# Patient Record
Sex: Male | Born: 1960
Health system: Southern US, Community
[De-identification: ages and names within clinical notes are randomized; demographics above are authoritative.]

## PROBLEM LIST (undated history)

## (undated) DIAGNOSIS — K4091 Unilateral inguinal hernia, without obstruction or gangrene, recurrent: Secondary | ICD-10-CM

## (undated) DIAGNOSIS — F419 Anxiety disorder, unspecified: Secondary | ICD-10-CM

## (undated) DIAGNOSIS — I10 Essential (primary) hypertension: Secondary | ICD-10-CM

## (undated) HISTORY — DX: Anxiety disorder, unspecified: F41.9

## (undated) HISTORY — DX: Essential (primary) hypertension: I10

## (undated) HISTORY — PX: FINGER SURGERY: SHX640

## (undated) HISTORY — PX: OTHER SURGICAL HISTORY: SHX169

## (undated) HISTORY — DX: Unilateral inguinal hernia, without obstruction or gangrene, recurrent: K40.91

---
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
## 1996-01-18 HISTORY — PX: VASECTOMY: SHX75

## 2003-08-04 ENCOUNTER — Encounter: Admission: RE | Admit: 2003-08-04 | Discharge: 2003-08-04 | Payer: Self-pay | Admitting: Family Medicine

## 2003-12-04 ENCOUNTER — Ambulatory Visit: Payer: Self-pay | Admitting: Family Medicine

## 2003-12-08 ENCOUNTER — Ambulatory Visit: Payer: Self-pay | Admitting: *Deleted

## 2003-12-10 ENCOUNTER — Ambulatory Visit: Payer: Self-pay | Admitting: Family Medicine

## 2003-12-19 ENCOUNTER — Ambulatory Visit: Payer: Self-pay | Admitting: *Deleted

## 2003-12-26 ENCOUNTER — Ambulatory Visit: Payer: Self-pay | Admitting: *Deleted

## 2003-12-31 ENCOUNTER — Ambulatory Visit: Payer: Self-pay | Admitting: Family Medicine

## 2004-01-02 ENCOUNTER — Ambulatory Visit: Payer: Self-pay | Admitting: *Deleted

## 2004-01-23 ENCOUNTER — Ambulatory Visit: Payer: Self-pay | Admitting: *Deleted

## 2004-01-26 ENCOUNTER — Ambulatory Visit: Payer: Self-pay | Admitting: *Deleted

## 2004-01-30 ENCOUNTER — Ambulatory Visit: Payer: Self-pay | Admitting: Family Medicine

## 2004-02-02 ENCOUNTER — Ambulatory Visit: Payer: Self-pay | Admitting: *Deleted

## 2004-02-09 ENCOUNTER — Ambulatory Visit: Payer: Self-pay | Admitting: *Deleted

## 2004-02-16 ENCOUNTER — Ambulatory Visit: Payer: Self-pay | Admitting: *Deleted

## 2004-02-20 ENCOUNTER — Ambulatory Visit: Payer: Self-pay | Admitting: *Deleted

## 2004-02-27 ENCOUNTER — Ambulatory Visit: Payer: Self-pay | Admitting: Family Medicine

## 2004-05-11 ENCOUNTER — Ambulatory Visit: Payer: Self-pay | Admitting: Family Medicine

## 2004-05-31 ENCOUNTER — Ambulatory Visit: Payer: Self-pay | Admitting: Family Medicine

## 2004-07-05 ENCOUNTER — Ambulatory Visit: Payer: Self-pay | Admitting: *Deleted

## 2004-07-08 ENCOUNTER — Ambulatory Visit: Payer: Self-pay | Admitting: Family Medicine

## 2004-07-12 ENCOUNTER — Ambulatory Visit: Payer: Self-pay | Admitting: *Deleted

## 2004-07-26 ENCOUNTER — Ambulatory Visit: Payer: Self-pay | Admitting: *Deleted

## 2004-08-10 ENCOUNTER — Ambulatory Visit: Payer: Self-pay | Admitting: *Deleted

## 2004-08-13 ENCOUNTER — Ambulatory Visit: Payer: Self-pay | Admitting: *Deleted

## 2004-08-23 ENCOUNTER — Ambulatory Visit: Payer: Self-pay | Admitting: *Deleted

## 2004-09-17 ENCOUNTER — Ambulatory Visit: Payer: Self-pay | Admitting: Family Medicine

## 2005-01-14 ENCOUNTER — Ambulatory Visit: Payer: Self-pay | Admitting: Family Medicine

## 2006-01-30 ENCOUNTER — Ambulatory Visit: Payer: Self-pay | Admitting: Family Medicine

## 2006-01-30 LAB — CONVERTED CEMR LAB
ALT: 35 units/L (ref 0–40)
CO2: 31 meq/L (ref 19–32)
Chloride: 106 meq/L (ref 96–112)
Creatinine, Ser: 1.2 mg/dL (ref 0.4–1.5)
GFR calc Af Amer: 84 mL/min
GFR calc non Af Amer: 69 mL/min
Glucose, Bld: 102 mg/dL — ABNORMAL HIGH (ref 70–99)
Glucose, Bld: 102 mg/dL — ABNORMAL HIGH (ref 70–99)
HDL: 37.7 mg/dL — ABNORMAL LOW (ref >39.0)
LDL Cholesterol: 132 mg/dL — ABNORMAL HIGH (ref 0–99)
Lymphocytes Relative: 38 % (ref 12.0–46.0)
MCHC: 34.1 g/dL (ref 30.0–36.0)
MCV: 95.1 fL (ref 78.0–100.0)
Monocytes Absolute: 0.5 10*3/uL (ref 0.2–0.7)
Monocytes Relative: 11.4 % — ABNORMAL HIGH (ref 3.0–11.0)
Neutro Abs: 2.1 10*3/uL (ref 1.4–7.7)
Platelets: 163 10*3/uL (ref 150–400)
RBC: 4.64 M/uL (ref 4.22–5.81)
Sodium: 141 meq/L (ref 135–145)
TSH: 1.26 microintl units/mL (ref 0.35–5.50)
Total CHOL/HDL Ratio: 5.1
Triglycerides: 117 mg/dL (ref 0–149)
VLDL: 23 mg/dL (ref 0–40)

## 2006-02-06 ENCOUNTER — Ambulatory Visit: Payer: Self-pay | Admitting: Family Medicine

## 2006-04-28 ENCOUNTER — Ambulatory Visit: Payer: Self-pay | Admitting: Family Medicine

## 2006-05-12 ENCOUNTER — Ambulatory Visit: Payer: Self-pay | Admitting: *Deleted

## 2006-05-19 ENCOUNTER — Ambulatory Visit: Payer: Self-pay | Admitting: *Deleted

## 2006-05-21 ENCOUNTER — Emergency Department (HOSPITAL_COMMUNITY): Admission: EM | Admit: 2006-05-21 | Discharge: 2006-05-21 | Payer: Self-pay | Admitting: Emergency Medicine

## 2006-05-26 ENCOUNTER — Ambulatory Visit: Payer: Self-pay | Admitting: Internal Medicine

## 2006-09-28 DIAGNOSIS — K4090 Unilateral inguinal hernia, without obstruction or gangrene, not specified as recurrent: Secondary | ICD-10-CM | POA: Insufficient documentation

## 2006-09-28 DIAGNOSIS — I10 Essential (primary) hypertension: Secondary | ICD-10-CM

## 2006-09-28 DIAGNOSIS — J309 Allergic rhinitis, unspecified: Secondary | ICD-10-CM | POA: Insufficient documentation

## 2006-10-05 ENCOUNTER — Telehealth: Payer: Self-pay | Admitting: Family Medicine

## 2006-10-20 ENCOUNTER — Ambulatory Visit: Payer: Self-pay | Admitting: *Deleted

## 2006-10-27 ENCOUNTER — Ambulatory Visit: Payer: Self-pay | Admitting: *Deleted

## 2006-11-03 ENCOUNTER — Ambulatory Visit: Payer: Self-pay | Admitting: *Deleted

## 2007-01-01 ENCOUNTER — Telehealth: Payer: Self-pay | Admitting: Family Medicine

## 2007-01-03 ENCOUNTER — Encounter: Admission: RE | Admit: 2007-01-03 | Discharge: 2007-01-10 | Payer: Self-pay | Admitting: Family Medicine

## 2007-01-11 ENCOUNTER — Encounter: Admission: RE | Admit: 2007-01-11 | Discharge: 2007-04-11 | Payer: Self-pay | Admitting: Family Medicine

## 2007-03-02 ENCOUNTER — Ambulatory Visit: Payer: Self-pay | Admitting: Family Medicine

## 2007-03-02 LAB — CONVERTED CEMR LAB
ALT: 25 units/L (ref 0–53)
AST: 29 units/L (ref 0–37)
Albumin: 4.2 g/dL (ref 3.5–5.2)
Alkaline Phosphatase: 64 units/L (ref 39–117)
BUN: 20 mg/dL (ref 6–23)
Basophils Absolute: 0 10*3/uL (ref 0.0–0.1)
Basophils Relative: 0 % (ref 0.0–1.0)
Bilirubin Urine: NEGATIVE
CO2: 30 meq/L (ref 19–32)
Calcium: 9.4 mg/dL (ref 8.4–10.5)
Chloride: 103 meq/L (ref 96–112)
Cholesterol: 197 mg/dL (ref 0–200)
Creatinine, Ser: 1.2 mg/dL (ref 0.4–1.5)
HDL: 35.2 mg/dL — ABNORMAL LOW (ref >39.0)
Hemoglobin: 15.4 g/dL (ref 13.0–17.0)
LDL Cholesterol: 143 mg/dL — ABNORMAL HIGH (ref 0–99)
MCHC: 33.9 g/dL (ref 30.0–36.0)
Monocytes Absolute: 0.6 10*3/uL (ref 0.2–0.7)
Monocytes Relative: 14.1 % — ABNORMAL HIGH (ref 3.0–11.0)
Nitrite: NEGATIVE
Protein, U semiquant: NEGATIVE
RBC: 4.77 M/uL (ref 4.22–5.81)
RDW: 11.9 % (ref 11.5–14.6)
Specific Gravity, Urine: 1.02
Total Bilirubin: 1.4 mg/dL — ABNORMAL HIGH (ref 0.3–1.2)
Total CHOL/HDL Ratio: 5.6
Total Protein: 7 g/dL (ref 6.0–8.3)
Urobilinogen, UA: 0.2
VLDL: 19 mg/dL (ref 0–40)
pH: 6

## 2007-03-08 ENCOUNTER — Ambulatory Visit: Payer: Self-pay | Admitting: Family Medicine

## 2007-03-08 DIAGNOSIS — F4322 Adjustment disorder with anxiety: Secondary | ICD-10-CM

## 2007-06-14 ENCOUNTER — Telehealth: Payer: Self-pay | Admitting: Internal Medicine

## 2007-10-31 ENCOUNTER — Telehealth: Payer: Self-pay | Admitting: Family Medicine

## 2007-12-18 ENCOUNTER — Ambulatory Visit: Payer: Self-pay | Admitting: Family Medicine

## 2007-12-18 DIAGNOSIS — J029 Acute pharyngitis, unspecified: Secondary | ICD-10-CM | POA: Insufficient documentation

## 2007-12-18 DIAGNOSIS — F528 Other sexual dysfunction not due to a substance or known physiological condition: Secondary | ICD-10-CM | POA: Insufficient documentation

## 2007-12-25 ENCOUNTER — Ambulatory Visit: Payer: Self-pay | Admitting: Family Medicine

## 2007-12-25 LAB — CONVERTED CEMR LAB: Rapid Strep: NEGATIVE

## 2007-12-28 ENCOUNTER — Telehealth (INDEPENDENT_AMBULATORY_CARE_PROVIDER_SITE_OTHER): Payer: Self-pay | Admitting: *Deleted

## 2008-12-05 ENCOUNTER — Ambulatory Visit: Payer: Self-pay | Admitting: Family Medicine

## 2008-12-05 DIAGNOSIS — M25519 Pain in unspecified shoulder: Secondary | ICD-10-CM | POA: Insufficient documentation

## 2009-01-06 ENCOUNTER — Telehealth: Payer: Self-pay | Admitting: Family Medicine

## 2009-03-10 ENCOUNTER — Telehealth: Payer: Self-pay | Admitting: Family Medicine

## 2009-03-20 ENCOUNTER — Ambulatory Visit: Payer: Self-pay | Admitting: Family Medicine

## 2009-03-20 DIAGNOSIS — M25559 Pain in unspecified hip: Secondary | ICD-10-CM

## 2009-04-13 ENCOUNTER — Telehealth: Payer: Self-pay | Admitting: Family Medicine

## 2009-04-22 ENCOUNTER — Ambulatory Visit: Payer: Self-pay | Admitting: Family Medicine

## 2009-05-14 ENCOUNTER — Telehealth: Payer: Self-pay | Admitting: Family Medicine

## 2009-06-09 ENCOUNTER — Telehealth: Payer: Self-pay | Admitting: Family Medicine

## 2009-09-28 ENCOUNTER — Telehealth: Payer: Self-pay | Admitting: Family Medicine

## 2009-09-28 ENCOUNTER — Ambulatory Visit: Payer: Self-pay | Admitting: Family Medicine

## 2009-09-28 LAB — CONVERTED CEMR LAB
AST: 25 units/L (ref 0–37)
Alkaline Phosphatase: 67 units/L (ref 39–117)
Basophils Absolute: 0 10*3/uL (ref 0.0–0.1)
Bilirubin, Direct: 0.1 mg/dL (ref 0.0–0.3)
CO2: 29 meq/L (ref 19–32)
Calcium: 9.2 mg/dL (ref 8.4–10.5)
Creatinine, Ser: 1.2 mg/dL (ref 0.4–1.5)
Eosinophils Absolute: 0.1 10*3/uL (ref 0.0–0.7)
GFR calc non Af Amer: 67.56 mL/min (ref >60)
Glucose, Bld: 89 mg/dL (ref 70–99)
HDL: 34.7 mg/dL — ABNORMAL LOW (ref >39.00)
Ketones, urine, test strip: NEGATIVE
Lymphocytes Relative: 31.7 % (ref 12.0–46.0)
MCHC: 34.7 g/dL (ref 30.0–36.0)
Monocytes Relative: 11.3 % (ref 3.0–12.0)
Neutrophils Relative %: 54.9 % (ref 43.0–77.0)
Nitrite: NEGATIVE
RBC: 4.48 M/uL (ref 4.22–5.81)
RDW: 13.2 % (ref 11.5–14.6)
Specific Gravity, Urine: 1.025
Total CHOL/HDL Ratio: 5
Triglycerides: 153 mg/dL — ABNORMAL HIGH (ref 0.0–149.0)
Urobilinogen, UA: 0.2
VLDL: 30.6 mg/dL (ref 0.0–40.0)

## 2009-09-29 ENCOUNTER — Telehealth: Payer: Self-pay | Admitting: Family Medicine

## 2009-10-02 ENCOUNTER — Ambulatory Visit: Payer: Self-pay | Admitting: Family Medicine

## 2010-02-07 ENCOUNTER — Encounter: Payer: Self-pay | Admitting: Family Medicine

## 2010-02-09 ENCOUNTER — Telehealth: Payer: Self-pay | Admitting: Family Medicine

## 2010-02-11 ENCOUNTER — Telehealth: Payer: Self-pay | Admitting: Family Medicine

## 2010-02-12 ENCOUNTER — Ambulatory Visit
Admission: RE | Admit: 2010-02-12 | Discharge: 2010-02-12 | Payer: Self-pay | Source: Home / Self Care | Attending: Family Medicine | Admitting: Family Medicine

## 2010-02-12 ENCOUNTER — Telehealth: Payer: Self-pay | Admitting: Family Medicine

## 2010-02-12 ENCOUNTER — Ambulatory Visit: Admit: 2010-02-12 | Payer: Self-pay | Admitting: Family Medicine

## 2010-02-17 NOTE — Progress Notes (Signed)
Summary: Pt needs to get FMLA form done  Phone Note Call from Patient Call back at [phone] cell   Caller: Patient Summary of Call: Pt called and wants to know what step he needs to take to get a medical leave of absense from work and to get FML form done by Dr. Tawanna Cooler.  Initial call taken by: Lucy Antigua,  April 13, 2009 1:55 PM  Follow-up for Phone Call        for what reason???????????? Follow-up by: Roderick Pee MD,  April 13, 2009 2:45 PM  Additional Follow-up for Phone Call Additional follow up Details #1::        patient states that he is going through a nasty divorce with his court date on Monday.  Also work is going through [employer] safety [employer] and "coming down one him hard".  He feels his stress level is becoming too difficult and may need some time off from work for anxiety.  He can not leave work because he will loose his commition and possible job.  he does plan on looking for a new job soon. Additional Follow-up by: Kern Reap CMA Duncan Dull),  April 14, 2009 9:52 AM    Additional Follow-up for Phone Call Additional follow up Details #2::    t FMLA is not appropriate for issues related to job stress divorce proceedings etc. Follow-up by: Roderick Pee MD,  April 14, 2009 11:35 AM  Additional Follow-up for Phone Call Additional follow up Details #3:: Details for Additional Follow-up Action Taken: left message on machine for patient  Additional Follow-up by: Kern Reap CMA Duncan Dull),  April 14, 2009 5:20 PM

## 2010-02-17 NOTE — Progress Notes (Signed)
  Phone Note Call from Patient   Caller: Patient Call For: Roderick Pee MD Summary of Call: Pt is concerned about his STD symptoms, and does not want to wait until he can get here from [place].  He will go to URGENT care there. Initial call taken by: Lynann Beaver CMA,  September 29, 2009 10:16 AM

## 2010-02-17 NOTE — Assessment & Plan Note (Signed)
Summary: CPX // RS   Vital Signs:  Patient profile:   50 year old male Height:      72 inches Weight:      215 pounds BMI:     29.26 Temp:     97.3 degrees F oral BP sitting:   130 / 84  (left arm) Cuff size:   regular  Vitals Entered By: Kern Reap CMA Duncan Dull) (October 02, 2009 4:47 PM) CC: cpx Is Patient Diabetic? No Pain Assessment Patient in pain? no        CC:  cpx.  History of Present Illness: Michael Bradford is a 50 year old divorced male, nonsmoker, who comes in today for physical examination  He has a history of underlying hypertension, for which he takes losartan 50 mg daily.  BP 130/84.  This is consistent with what his blood pressure is at home.  He takes Klonopin 1 mg a half a tablet at bedtime to 3 times a week for sleep.  He states that he exercises that day.  He sleeps well, if he doesn't exercise and has sleep dysfunction.  He also takes a p.r.n. Ativan .5.  Prior to speaking.  He also takes p.r.n. Zovirax.  He gets routine eye care.  Dental care.  Tetanus 2007 seasonal flu shot today.  He also has some mild allergic rhinitis.  Last year.  We sent him to orthopedics for evaluation of hip pain.  The workup shows some evidence of old trauma.  Indeed, when he was 15.  He fell 20 feet.  He states they gave him a prednisone Dosepak.  The pain went away.  If not recurred.  His neck process of moving back to [place].  Preventive Screening-Counseling & Management  Alcohol-Tobacco     Smoking Cessation Counseling: yes  Allergies: No Known Drug Allergies  Past History:  Past medical, surgical, family and social histories (including risk factors) reviewed, and no changes noted (except as noted below).  Past Medical History: Reviewed history from 03/08/2007 and no changes required. Allergic rhinitis Hypertension R ing hernia--aymptomatic herpes simplex anxiety, situational  Past Surgical History: Reviewed history from 09/28/2006 and no changes  required. wisdom teeth R eye trauma HTN eval age 50 finger surgery X3 vocal polyps X2  Family History: Reviewed history from 03/08/2007 and no changes required. father died last year at 50, smoker, obese, hypertension, chronic renal failure, and an MRI mother 27, room arthritis, smoker, I be her blood pressure, diabetes 3 sisters in good health.  One has thyroid trouble two brothers one has hypertension and hyperlipidemia is in a coma from a motor vehicle accident other brother in good health  Social History: Reviewed history from 03/08/2007 and no changes required. 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 no    Any prior history of allergic reactions to egg and/or gelatin? no    Do you have a sensitivity to the preservative Thimersol? no    Do you have a past history of Guillan-Barre Syndrome? no    Do you currently have an acute febrile illness? no    Have you ever had a severe reaction to latex? no    Vaccine information given and explained to patient? yes    Are you currently pregnant? no  Lot Number:[id number]   Exp Date:6/50/2012   Site Given  Left Deltoid IM   Physical Exam  General:  Well-developed,well-nourished,in no acute distress; alert,appropriate and cooperative throughout examination Head:  Normocephalic and atraumatic without obvious abnormalities. No apparent alopecia or balding. Eyes:  No corneal or conjunctival inflammation noted. EOMI. Perrla. Funduscopic exam benign, without hemorrhages, exudates or papilledema. Vision grossly normal. Ears:  External ear exam shows no significant lesions or deformities.  Otoscopic examination reveals clear canals, tympanic membranes are intact bilaterally without bulging, retraction, inflammation or discharge. Hearing  is grossly normal bilaterally. Nose:  External nasal examination shows no deformity or inflammation. Nasal mucosa are pink and moist without lesions or exudates. Mouth:  Oral mucosa and oropharynx without lesions or exudates.  Teeth in good repair. Neck:  No deformities, masses, or tenderness noted. Chest Wall:  No deformities, masses, tenderness or gynecomastia noted. Breasts:  No masses or gynecomastia noted Lungs:  Normal respiratory effort, chest expands symmetrically. Lungs are clear to auscultation, no crackles or wheezes. Heart:  Normal rate and regular rhythm. S1 and S2 normal without gallop, murmur, click, rub or other extra sounds. Abdomen:  Bowel sounds positive,abdomen soft and non-tender without masses, organomegaly or hernias noted. Rectal:  No external abnormalities noted. Normal sphincter tone. No rectal masses or tenderness. Genitalia:  Testes bilaterally descended without nodularity, tenderness or masses. No scrotal masses or lesions. No penis lesions or urethral discharge. Prostate:  Prostate gland firm and smooth, no enlargement, nodularity, tenderness, mass, asymmetry or induration. Msk:  No deformity or scoliosis noted of thoracic or lumbar spine.   Pulses:  R and L carotid,radial,femoral,dorsalis pedis and posterior tibial pulses are full and equal bilaterally Extremities:  No clubbing, cyanosis, edema, or deformity noted with normal full range of motion of all joints.   Neurologic:  No cranial nerve deficits noted. Station and gait are normal. Plantar reflexes are down-going bilaterally. DTRs are symmetrical throughout. Sensory, motor and coordinative functions appear intact. Skin:  Intact without suspicious lesions or rashes Cervical Nodes:  No lymphadenopathy noted Axillary Nodes:  No palpable lymphadenopathy Inguinal Nodes:  No significant adenopathy Psych:  Cognition and judgment appear intact. Alert and cooperative with normal attention span and concentration. No  apparent delusions, illusions, hallucinations   Impression & Recommendations:  Problem # 1:  PHYSICAL EXAMINATION (ICD-V70.0) Assessment Unchanged  Problem # 2:  HYPERTENSION (ICD-401.9) Assessment: Improved  His updated medication list for this problem includes:    Cozaar 50 Mg Tabs (Losartan potassium) .Marland Kitchen... Take one tab once daily  Orders: EKG w/ Interpretation (93000)  Problem # 3:  ALLERGIC RHINITIS (ICD-477.9) Assessment: Improved  Problem # 4:  ADJUSTMENT DISORDER WITH ANXIETY (ICD-309.24) Assessment: Improved  Complete Medication List: 1)  Advil 200 Mg Tabs (Ibuprofen) .... As needed 2)  Zovirax 200 Mg Caps (Acyclovir) .... 3 in am, 2 in pm as needed 3)  Daily Multiple Vitamins Tabs (Multiple vitamin) .... Once daily 4)  Klonopin 1 Mg Tabs (Clonazepam) .... Take one tab at bedtime as needed for sleep 5)  Cozaar 50 Mg Tabs (Losartan potassium) .... Take one tab once daily 6)  Ativan 0.5 Mg Tabs (Lorazepam) .Marland Kitchen.. 1 by mouth 1 hour < speaking  Other Orders: Admin 1st Vaccine (16109) Flu Vaccine 83yrs + (60454)  Patient Instructions: 1)  Please schedule a follow-up appointment in 1 year. 2)  Tobacco is very bad for your health and your loved ones! You Should stop smoking!. 3)  Stop Smoking Tips: Choose a Quit date. Cut  down before the Quit date. decide what you will do as a substitute when you feel the urge to smoke(gum,toothpick,exercise). 4)  It is important that you exercise regularly at least 20 minutes 5 times a week. If you develop chest pain, have severe difficulty breathing, or feel very tired , stop exercising immediately and seek medical attention. 5)  Take an Aspirin every day. Prescriptions: COZAAR 50 MG TABS (LOSARTAN POTASSIUM) take one tab once daily  #100 x 3   Entered and Authorized by:   Roderick Pee MD   Signed by:   Roderick Pee MD on 10/02/2009   Method used:   Print then Give to Patient   RxID:   [id number] ZOVIRAX 200 MG  CAPS  (ACYCLOVIR) 3 in am, 2 in pm as needed  #100 Capsule x 2   Entered and Authorized by:   Roderick Pee MD   Signed by:   Roderick Pee MD on 10/02/2009   Method used:   Print then Give to Patient   RxID:   [id number] KLONOPIN 1 MG TABS (CLONAZEPAM) take one tab at bedtime as needed for sleep  #50 x 3   Entered and Authorized by:   Roderick Pee MD   Signed by:   Roderick Pee MD on 10/02/2009   Method used:   Print then Give to Patient   RxID:   [id number] ATIVAN 0.5 MG TABS (LORAZEPAM) 1 by mouth 1 hour < speaking  #20 x 1   Entered and Authorized by:   Roderick Pee MD   Signed by:   Roderick Pee MD on 10/02/2009   Method used:   Print then Give to Patient   RxID:   [id number]

## 2010-02-17 NOTE — Assessment & Plan Note (Signed)
Summary: MED CHECK//CCM   Vital Signs:  Patient profile:   50 year old male Weight:      210 pounds Temp:     98.9 degrees F oral BP sitting:   150 / 88  (left arm) Cuff size:   regular  Vitals Entered By: Kern Reap CMA Duncan Dull) (March 20, 2009 2:29 PM)  Reason for Visit follow up meds, hip pain, tick bite  History of Present Illness: Michael Bradford is a 50 year old, divorced male, nonsmoker, who comes in today for evaluation of 3 problems.  He has underlying hypertension, which he takes Cozaar, 50 mg daily BP at home 120/76.  BP here 150/80.  We will go by his home blood pressure readings, because we've checked his device, and we, know it's accurate  Three days ago, he noticed a tick on his left upper arm.  He tried to pull it off, and it bit him.  He has a slight red mark.  No fever.  The issue of [facility] spotted fever was explained to Michael Bradford.  He noticed this within the next two weeks he starts running fever to call immediately.  He also takes V  50 mg p.r.n. for ED he finds it costs too high.  He also is pain in his right buttocks when he begins to play soccer.  He said his hips x-rayed before and they been normal.  He describes the discomfort as something that comes and goes on a scale of one to 10 it can be a 1.8.  He takes Motrin with good relief.  After the third or fourth day of soccer practice.  The discomfort goes away  Allergies: No Known Drug Allergies  Past History:  Past medical, surgical, family and social histories (including risk factors) reviewed for relevance to current acute and chronic problems.  Past Medical History: Reviewed history from 03/08/2007 and no changes required. Allergic rhinitis Hypertension R ing hernia--aymptomatic herpes simplex anxiety, situational  Past Surgical History: Reviewed history from 09/28/2006 and no changes required. wisdom teeth R eye trauma HTN eval age 71 finger surgery X3 vocal polyps X2  Family  History: Reviewed history from 03/08/2007 and no changes required. father died last year at 58, smoker, obese, hypertension, chronic renal failure, and an MRI mother 84, room arthritis, smoker, I be her blood pressure, diabetes 3 sisters in good health.  One has thyroid trouble two brothers one has hypertension and hyperlipidemia is in a coma from a motor vehicle accident other brother in good health  Social History: Reviewed history from 03/08/2007 and no changes required. 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 Uad    Viagra 50 Mg Tabs (Sildenafil citrate) .Marland Kitchen... Take one tab at bedtime as needed His updated medication list for this problem includes:    Viagra 100 Mg Tabs (Sildenafil citrate) ..... Uad  Orders: Prescription Created Electronically [id number])  Problem # 2:  HYPERTENSION (ICD-401.9) Assessment: Improved  The  following medications were removed from the medication list:    Hyzaar 50-12.5 Mg Tabs (Losartan potassium-hctz) .Marland Kitchen... Take 1 tablet by mouth every morning His updated medication list for this problem includes:    Cozaar 50 Mg Tabs (Losartan potassium) .Marland Kitchen... Take one tab once daily  Problem # 3:  HIP PAIN, RIGHT (ICD-719.45) Assessment: New  His updated medication list for this problem includes:    Advil 200 Mg Tabs (Ibuprofen) .Marland Kitchen... As needed  Orders: Prescription Created  Electronically [phone])  Problem # 4:  TICK BITE (ICD-E906.4) Assessment: New  Orders: Prescription Created Electronically [phone])  Complete Medication List: 1)  Advil 200 Mg Tabs (Ibuprofen) .... As needed 2)  Zovirax 200 Mg Caps (Acyclovir) .... 3 in am, 2 in pm as needed 3)  Daily Multiple Vitamins Tabs (Multiple vitamin) .... Once daily 4)  Klonopin 1 Mg Tabs (Clonazepam) .... Take one tab at bedtime as needed for sleep 5)  Cozaar 50 Mg Tabs (Losartan potassium) .... Take one tab once daily 6)  Viagra 100 Mg Tabs (Sildenafil citrate) .... Uad  Patient Instructions: 1)  if in the next two weeks u  run fever,  ache all over.  Call immediately 2)  you can make the Viagra, more cost effective by taking the hundred milligram pills, and cutting them in half as or 1/4  3)  Take 600 mg of Motrin one hour prior to exercise after exercise.  Ice. 4)  Continue the Cozaar, 50 mg daily.  Since y  blood pressure is normal........  Just checking weekly Prescriptions: COZAAR 50 MG TABS (LOSARTAN POTASSIUM) take one tab once daily  #100 x 3   Entered and Authorized by:   Roderick Pee MD   Signed by:   Roderick Pee MD on 03/20/2009   Method used:   Electronically to        [facility] [phone]* (retail)       [address]       [place], [place]  [address]       Ph: [phone] or [phone]       Fax: [phone]   RxID:   [id number] COZAAR 50 MG TABS (LOSARTAN POTASSIUM) take one tab once daily  #100 x 3   Entered and Authorized by:   Roderick Pee MD   Signed by:   Roderick Pee MD on 03/20/2009   Method used:   Print then Give to Patient   RxID:   [id number] VIAGRA 100 MG TABS (SILDENAFIL CITRATE) UAD  #6 x 11   Entered and Authorized by:   Roderick Pee MD   Signed by:   Roderick Pee MD on 03/20/2009   Method used:   Print then Give to Patient   RxID:   [id number]

## 2010-02-17 NOTE — Progress Notes (Signed)
Summary: seroquel rx  Phone Note From Pharmacy   Summary of Call: patient is requesting a rx for seroquel is this okay to fill? Initial call taken by: Kern Reap CMA Duncan Dull),  May 14, 2009 8:49 AM  Follow-up for Phone Call        I don't use this product.......... this is a product.that is typically prescribed by a psychiatrist.......Marland Kitchen recommend Dr. Rolly Pancake, [place] Follow-up by: Roderick Pee MD,  May 14, 2009 9:05 AM  Additional Follow-up for Phone Call Additional follow up Details #1::        Pt given Dr. Nelida Meuse recommendations. Additional Follow-up by: Lynann Beaver CMA,  May 14, 2009 9:54 AM

## 2010-02-17 NOTE — Assessment & Plan Note (Signed)
Summary: 1 month rov/njr   Vital Signs:  Patient profile:   50 year old male Weight:      212 pounds Temp:     97.9 degrees F oral BP sitting:   120 / 80  (left arm) Cuff size:   regular  Vitals Entered By: Kern Reap CMA Duncan Dull) (April 22, 2009 8:31 AM) CC: follow-up visit   CC:  follow-up visit.  History of Present Illness: Michael Bradford is a 50 year old male in the process of a divorce.  He was also moving to [place], [place] to begin a new job this coming May, who comes in today for evaluation of hypertension, and anxiety.  His hypertension is treated with Cozaar 50 mg daily.  BP 120/80.  No side effects from medication.  His anxiety is related to the fact that he is in the midst of a divorce and is moving to [place] to assume a new job.  He states his current company where he works now as aware of his new job and he works in a hostile environment.  Because of the job stress and the marital stress he would like to take a couple weeks off of work.  I explained this would be up to him.Marland Kitchen  He would like an FMLA form.  We gave him the criteria for a fmla and asked him to work with the HR person.  He says the HR person is in [place].  They have no person on site.  I told him I would be happy to sign the form of a fall and  that he could deal with the HR people to define if the fmla  process in light of this situation.  Allergies: No Known Drug Allergies  Past History:  Past medical, surgical, family and social histories (including risk factors) reviewed for relevance to current acute and chronic problems.  Past Medical History: Reviewed history from 03/08/2007 and no changes required. Allergic rhinitis Hypertension R ing hernia--aymptomatic herpes simplex anxiety, situational  Past Surgical History: Reviewed history from 09/28/2006 and no changes required. wisdom teeth R eye trauma HTN eval age 79 finger surgery X3 vocal polyps X2  Family History: Reviewed  history from 03/08/2007 and no changes required. father died last year at 75, smoker, obese, hypertension, chronic renal failure, and an MRI mother 46, room arthritis, smoker, I be her blood pressure, diabetes 3 sisters in good health.  One has thyroid trouble two brothers one has hypertension and hyperlipidemia is in a coma from a motor vehicle accident other brother in good health  Social History: Reviewed history from 03/08/2007 and no changes required. 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 Take one tab once daily  Complete Medication List: 1)  Advil 200 Mg Tabs (Ibuprofen) .... As needed 2)  Zovirax 200 Mg Caps (Acyclovir) .... 3 in am, 2 in pm as needed 3)  Daily Multiple Vitamins Tabs (Multiple vitamin) .... Once daily 4)  Klonopin 1 Mg Tabs (Clonazepam) .... Take one tab at bedtime as needed for sleep 5)  Cozaar 50 Mg Tabs (Losartan potassium) .... Take one tab once daily 6)  Viagra 100 Mg Tabs (Sildenafil citrate) .... Uad  Patient Instructions: 1)    Continue current medications.  Return for a physical examination ASAP

## 2010-02-17 NOTE — Progress Notes (Signed)
Summary: Pt has question re: cpx and personal matter.Req Dr. Tawanna Cooler to call  Phone Note Call from Patient Call back at Home Phone [phone]   Caller: Patient Summary of Call: Pt has questions re: recent cpx and a personal question. Pt is req that Dr. Tawanna Cooler return his call.  Initial call taken by: Lucy Antigua,  September 28, 2009 4:00 PM  Follow-up for Phone Call        Fleet Contras please call Follow-up by: Roderick Pee MD,  September 28, 2009 4:14 PM  Additional Follow-up for Phone Call Additional follow up Details #1::        patient is calling because he had unprotected sex 2 weeks ago and now he is having irritation and discharge.  patient's cpx is friday and he is out of town.  any suggestion? Additional Follow-up by: Kern Reap CMA Duncan Dull),  September 28, 2009 5:09 PM    Additional Follow-up for Phone Call Additional follow up Details #2::    come here or go to an urgent care in [place]. Follow-up by: Roderick Pee MD,  September 29, 2009 10:45 AM

## 2010-02-17 NOTE — Progress Notes (Signed)
Summary: refill  Phone Note Refill Request Message from:  Fax from Pharmacy on Jun 09, 2009 4:36 PM  Refills Requested: Medication #1:  KLONOPIN 1 MG TABS take one tab at bedtime as needed for sleep Initial call taken by: Kern Reap CMA Duncan Dull),  Jun 09, 2009 4:36 PM    Prescriptions: KLONOPIN 1 MG TABS (CLONAZEPAM) take one tab at bedtime as needed for sleep  #30 x 3   Entered by:   Kern Reap CMA (AAMA)   Authorized by:   Roderick Pee MD   Signed by:   Kern Reap CMA (AAMA) on 06/09/2009   Method used:   Handwritten   RxID:   [id number]

## 2010-02-17 NOTE — Progress Notes (Signed)
Summary: ADVICE REQUESTED  Phone Note Call from Patient   Caller: Patient [phone] Reason for Call: Talk to Doctor Summary of Call: Pt called to adv that in regards to  Generic: Hydrochlorothiazide/Olmesartan  /   Brand Name: Benicar HCT ... Pt adv that he is having severe side effects (decrease in sex drive mostly) that he said he didn't have taking Benicar.... Pt would like to have your advice as to what to do??  Is there any difference between Hydrochlorothiazide/Olmesartan / Benicar HCT .Marland KitchenMarland Kitchen??  Could you prescribe med to help with side effects?  Pt would also like to have a refill RX for seroquel .[facility]land Kitchen... [facility] [address]  Initial call taken by: Debbra Riding,  March 10, 2009 9:00 AM  Follow-up for Phone Call        he may switch from the Hyzaar to Cozaar 50 mg daily.  It may be the diuretic part of the medication.  This causing a negative effect on his sex life.  Also located at Viagra 50 mg p.r.n., dispense 6,,,,, refills x 11  I don't prescribe Seroquel that should be done by a psychiatrist Follow-up by: Roderick Pee MD,  March 10, 2009 9:57 AM  Additional Follow-up for Phone Call Additional follow up Details #1::        patient is aware.  patient would also like to try a stronger dose of klonopin if possible? Additional Follow-up by: Kern Reap CMA Duncan Dull),  March 10, 2009 2:33 PM    Additional Follow-up for Phone Call Additional follow up Details #2::    seraquil and klonopin he can have a significant medication interactions.  This should be dealt with by the psychiatrist Follow-up by: Roderick Pee MD,  March 10, 2009 2:38 PM  New/Updated Medications: COZAAR 50 MG TABS (LOSARTAN POTASSIUM) take one tab once daily VIAGRA 50 MG TABS (SILDENAFIL CITRATE) take one tab at bedtime as needed Prescriptions: VIAGRA 50 MG TABS (SILDENAFIL CITRATE) take one tab at bedtime as needed  #6 x 11   Entered by:   Kern Reap CMA (AAMA)   Authorized by:   Roderick Pee MD   Signed by:   Kern Reap CMA (AAMA) on 03/10/2009   Method used:   [facility]        [facility] [phone]* (retail)       [address]       [place], [place]  [address]       Ph: [phone] or [phone]       Fax: [phone]   RxID:   [id number] COZAAR 50 MG TABS (LOSARTAN POTASSIUM) take one tab once daily  #30 x 0   Entered by:   Kern Reap CMA (AAMA)   Authorized by:   Roderick Pee MD   Signed by:   Kern Reap CMA (AAMA) on 03/10/2009   Method used:   [facility]ically to        [facility] [phone]* (retail)       [address]       [place], [place]  [address]       Ph: [phone] or [phone]       Fax: [phone]   RxID:   [id number]

## 2010-02-18 NOTE — Progress Notes (Signed)
Summary: REQUEST FOR RX  OR  APPT WITH ANOTHER DR TODAY  Phone Note Call from Patient   Caller: Patient Summary of Call: Pt can't come in for appt this afternoon as requested  because he has to leave by 1pm to go out of town and would like to [facility]..... or he can come in to be seen by another physician if Rx can't be sent in.  Initial call taken by: Debbra Riding,  February 12, 2010 11:32 AM  Follow-up for Phone Call        spoke with patient and he will come in this afternoon Follow-up by: Kern Reap CMA Duncan Dull),  February 12, 2010 11:41 AM

## 2010-02-18 NOTE — Progress Notes (Signed)
Summary: referral to gi  Phone Note Call from Patient Call back at Home Phone [phone]   Caller: Patient Call For: Roderick Pee MD Summary of Call: pt would like colonoscopy due to age Initial call taken by: Heron Sabins,  February 09, 2010 2:37 PM  Follow-up for Phone Call        he needs to get established with a primary in [place] where he currently lives and then they   Will refer him for a colonoscopy Follow-up by: Roderick Pee MD,  February 09, 2010 3:00 PM  Additional Follow-up for Phone Call Additional follow up Details #1::        left message on machine for patient  Additional Follow-up by: Kern Reap CMA Duncan Dull),  February 10, 2010 12:15 PM

## 2010-02-18 NOTE — Progress Notes (Signed)
Summary: GI referral  Phone Note Call from Patient Call back at Home Phone [phone]   Caller: Patient Summary of Call: patient called back and he has moved back to [place] and would like a referral for GI Initial call taken by: Kern Reap CMA Duncan Dull),  February 11, 2010 9:27 AM  Follow-up for Phone Call        ok to ref. to gi for screening colonoscopy Follow-up by: Roderick Pee MD,  February 11, 2010 10:19 AM  New Problems: SCREENING, COLON CANCER (ICD-V76.51)   New Problems: SCREENING, COLON CANCER (ICD-V76.51)

## 2010-02-18 NOTE — Assessment & Plan Note (Signed)
Summary: SINUS INF - PT TO ARRIVE AT 4:45PM PER DR TODD // RS   Vital Signs:  Patient profile:   50 year old male Weight:      218 pounds Temp:     98.3 degrees F oral BP sitting:   120 / 80  (left arm) Cuff size:   regular  Vitals Entered By: Kern Reap CMA Duncan Dull) (February 12, 2010 2:28 PM) CC: sinus infection   CC:  sinus infection.  History of Present Illness: Michael Bradford is a 50 year old divorced male, nonsmoker, with a history of underlying allergic rhinitis, who comes in today stating he has a sinus infection and wants a Z-Pak.  He said, head congestion, postnasal drip for for 5 days.  No fever, wheezing, et Karie Soda.  Allergies: No Known Drug Allergies PMH-FH-SH reviewed for relevance  Social History: Reviewed history from 03/08/2007 and no changes required. Occupation: time  Physiological scientist Married Never Smoked Alcohol use-yes Drug use-no Regular exercise-yes  Review of Systems      See HPI  Physical Exam  General:  Well-developed,well-nourished,in no acute distress; alert,appropriate and cooperative throughout examination Head:  Normocephalic and atraumatic without obvious abnormalities. No apparent alopecia or balding. Eyes:  No corneal or conjunctival inflammation noted. EOMI. Perrla. Funduscopic exam benign, without hemorrhages, exudates or papilledema. Vision grossly normal. Ears:  External ear exam shows no significant lesions or deformities.  Otoscopic examination reveals clear canals, tympanic membranes are intact bilaterally without bulging, retraction, inflammation or discharge. Hearing is grossly normal bilaterally. Nose:  septum in midline, and 3+ nasal edema, consistent with allergic rhinitis Mouth:  Oral mucosa and oropharynx without lesions or exudates.  Teeth in good repair. Neck:  No deformities, masses, or tenderness noted. Chest Wall:  No deformities, masses, tenderness or gynecomastia noted. Lungs:  Normal respiratory effort, chest expands  symmetrically. Lungs are clear to auscultation, no crackles or wheezes.   Impression & Recommendations:  Problem # 1:  ALLERGIC RHINITIS (ICD-477.9) Assessment Deteriorated  Complete Medication List: 1)  Advil 200 Mg Tabs (Ibuprofen) .... As needed 2)  Zovirax 200 Mg Caps (Acyclovir) .... 3 in am, 2 in pm as needed 3)  Daily Multiple Vitamins Tabs (Multiple vitamin) .... Once daily 4)  Klonopin 1 Mg Tabs (Clonazepam) .... Take one tab at bedtime as needed for sleep 5)  Cozaar 50 Mg Tabs (Losartan potassium) .... Take one tab once daily 6)  Ativan 0.5 Mg Tabs (Lorazepam) .Marland Kitchen.. 1 by mouth 1 hour < speaking 7)  Prednisone 20 Mg Tabs (Prednisone) .... Uad  Patient Instructions: 1)  begin prednisone, take two tabs now then starting tomorrow two tabs q.a.m. x 3 days, one x 3 days, a half x 3 days, then half a tablet every other day for two week taper. 2)  Tonight use Afrin nasal spray, one shot up each nostril prior to bedtime............ stop after 3 nights. 3)  Once she is finished.  The prednisone that I would take a 10-mg plain Zyrtec@  bedtime Prescriptions: PREDNISONE 20 MG TABS (PREDNISONE) UAD  #30 x 1   Entered and Authorized by:   Roderick Pee MD   Signed by:   Roderick Pee MD on 02/12/2010   Method used:   [facility]ronically to        [facility]  [phone]* (retail)       [address] [place], [place]  [address]       Ph: [phone] or [phone]  Fax: [phone]   RxID:   [id number]    Orders Added: 1)  Est. Patient Level III [84132]

## 2010-02-25 ENCOUNTER — Other Ambulatory Visit: Payer: Self-pay | Admitting: *Deleted

## 2010-02-25 ENCOUNTER — Encounter (INDEPENDENT_AMBULATORY_CARE_PROVIDER_SITE_OTHER): Payer: Self-pay | Admitting: *Deleted

## 2010-02-25 DIAGNOSIS — F419 Anxiety disorder, unspecified: Secondary | ICD-10-CM

## 2010-02-25 NOTE — Telephone Encounter (Signed)
patient  Is requesting a refill of lorazepam 0.5

## 2010-02-25 NOTE — Telephone Encounter (Signed)
Refill the lorazepam .5 mg, dispense 20 tabs, directions one p.o., one hour prior to speaking engagement, refills x 2

## 2010-02-26 MED ORDER — LORAZEPAM 0.5 MG PO TABS: 0.5000 mg | ORAL_TABLET | ORAL | Status: DC

## 2010-03-01 ENCOUNTER — Other Ambulatory Visit: Payer: Self-pay | Admitting: *Deleted

## 2010-03-01 MED ORDER — HYDROCODONE-HOMATROPINE 5-1.5 MG/5ML PO SYRP: 5.0000 mL | ORAL_SOLUTION | Freq: Four times a day (QID) | ORAL | Status: AC | PRN

## 2010-03-01 NOTE — Telephone Encounter (Signed)
patient  Is requesting a refill of hydromet.  [facility]  [address]

## 2010-03-01 NOTE — Telephone Encounter (Signed)
Hydromet dispense 4 ounces directions one half to 1 teaspoon nightly p.r.n. Cough.  No refills.  If cough does not resolve within a week to 10 days     Schedule office visit

## 2010-03-04 NOTE — Letter (Signed)
Summary: Pre Visit Letter Revised   Gastroenterology  [address] [place], [place] [address]   Phone: [phone]  Fax: [phone]        02/25/2010 MRN: [id number] [facility] [address]  [address] [place], [place]  [address]             Procedure Date:  04-05-10   Welcome to the Gastroenterology Division at [facility].    You are scheduled to see a nurse for your pre-procedure visit on 03-19-10 at 10:00A.M. on the 3rd floor at [facility], [address]  We ask that you try to arrive at our office 15 minutes prior to your appointment time to allow for check-in.  Please take a minute to review the attached form.  If you answer "Yes" to one or more of the questions on the first page, we ask that you call the person listed at your earliest opportunity.  If you answer "No" to all of the questions, please complete the rest of the form and bring it to your appointment.    Your nurse visit will consist of discussing your medical and surgical history, your immediate family medical history, and your medications.   If you are unable to list all of your medications on the form, please bring the medication bottles to your appointment and we will list them.  We will need to be aware of both prescribed and over the counter drugs.  We will need to know exact dosage information as well.    Please be prepared to read and sign documents such as consent forms, a financial agreement, and acknowledgement forms.  If necessary, and with your consent, a friend or relative is welcome to sit-in on the nurse visit with you.  Please bring your insurance card so that we may make a copy of it.  If your insurance requires a referral to see a specialist, please bring your referral form from your primary care physician.  No co-pay is required for this nurse visit.     If you cannot keep your appointment, please call [phone] to cancel or reschedule prior to your appointment date.  This  allows [place] the opportunity to schedule an appointment for another patient in need of care.    Thank you for choosing  Gastroenterology for your medical needs.  We appreciate the opportunity to care for you.  Please visit [place] at our website  to learn more about our practice.  Sincerely, The Gastroenterology Division

## 2010-03-23 ENCOUNTER — Telehealth: Payer: Self-pay | Admitting: Family Medicine

## 2010-03-23 MED ORDER — CLONAZEPAM 1 MG PO TABS: 1.0000 mg | ORAL_TABLET | ORAL | Status: DC

## 2010-03-23 NOTE — Telephone Encounter (Signed)
Pt called to ck on status of refill.... Aware that Dr Tawanna Cooler is out of office this week... He adv that he is completely out of med (Clonazepam).

## 2010-03-23 NOTE — Telephone Encounter (Signed)
Ok to rf #50  

## 2010-03-23 NOTE — Telephone Encounter (Signed)
Pt called and said that [facility], sent over a refill req for pts Clonazepam 1 mg on Friday 03/19/10. Pt was told by pharmacy that nothing has been rcvd yet. Pls call in today. Pt completely out of med.

## 2010-03-23 NOTE — Telephone Encounter (Signed)
Okay to refill #50 

## 2010-03-24 ENCOUNTER — Telehealth: Payer: Self-pay | Admitting: Family Medicine

## 2010-03-24 NOTE — Telephone Encounter (Signed)
[facility] - they did recv ok from rachel yesterday and rx is ready for pt to pick up.   Attempt to call pt - VM - left msg as above that med ready. Call if any problems

## 2010-04-05 ENCOUNTER — Other Ambulatory Visit: Payer: Self-pay | Admitting: Gastroenterology

## 2010-07-27 ENCOUNTER — Other Ambulatory Visit: Payer: Self-pay | Admitting: *Deleted

## 2010-07-27 MED ORDER — CLONAZEPAM 1 MG PO TABS: 1.0000 mg | ORAL_TABLET | ORAL | Status: DC

## 2010-07-27 NOTE — Telephone Encounter (Signed)
patient  Would like a refill of clonazepam is this okay to fill?

## 2010-07-27 NOTE — Telephone Encounter (Signed)
patient  Would like a refill of clonazepam 1 mg.  Last fill 06/18/10.  Last office visit

## 2010-07-27 NOTE — Telephone Encounter (Signed)
Okay to prescribe 30 tabs directions one nightly p.r.n..  He will need an office visit sometime in the next 30 days

## 2010-09-13 ENCOUNTER — Telehealth: Payer: Self-pay | Admitting: Family Medicine

## 2010-09-13 MED ORDER — CLONAZEPAM 1 MG PO TABS: 1.0000 mg | ORAL_TABLET | ORAL | Status: DC

## 2010-09-13 NOTE — Telephone Encounter (Signed)
Refill Klonopin 1 mg dispense 30 tabs directions one nightly p.r.n. Refills x 1 however, if symptoms persist, would recommend a consult with Andee Poles

## 2010-09-13 NOTE — Telephone Encounter (Signed)
[facility]opin[facility] Needs asap.

## 2010-09-17 ENCOUNTER — Other Ambulatory Visit: Payer: Self-pay | Admitting: Family Medicine

## 2010-09-27 ENCOUNTER — Other Ambulatory Visit: Payer: Self-pay | Admitting: *Deleted

## 2010-09-27 DIAGNOSIS — F419 Anxiety disorder, unspecified: Secondary | ICD-10-CM

## 2010-09-27 MED ORDER — LORAZEPAM 0.5 MG PO TABS: 0.5000 mg | ORAL_TABLET | ORAL | Status: DC

## 2010-10-21 ENCOUNTER — Other Ambulatory Visit: Payer: Self-pay | Admitting: Family Medicine

## 2010-10-31 ENCOUNTER — Other Ambulatory Visit: Payer: Self-pay | Admitting: Family Medicine

## 2010-11-17 ENCOUNTER — Telehealth: Payer: Self-pay | Admitting: Family Medicine

## 2010-11-17 NOTE — Telephone Encounter (Signed)
Pt was referred to Dr. Nolen Mu pt called to make appt and was told there was a  $150 new pt fee. Pt does not want to pay this. Pt is having problems falling asleep but not sleeping and is hoping toget back on the medication that he was on. Please contact pt

## 2010-11-18 NOTE — Telephone Encounter (Signed)
Recommend he see Dr. Althea Charon

## 2010-11-19 NOTE — Telephone Encounter (Signed)
Patient refuses to see Dr Nolen Mu but would like to know if he can have a refill of Klonopin?  [facility]

## 2010-11-22 NOTE — Telephone Encounter (Signed)
No........Marland Kitchenokay to choose a another psychiatrist.  He may contact [facility] for their recommendations of psychiatrists are taking new patients

## 2010-11-22 NOTE — Telephone Encounter (Signed)
Left detailed message on machine.

## 2010-12-21 HISTORY — PX: SHOULDER SURGERY: SHX246

## 2011-01-20 ENCOUNTER — Other Ambulatory Visit: Payer: Self-pay | Admitting: Family Medicine

## 2011-02-22 ENCOUNTER — Ambulatory Visit (INDEPENDENT_AMBULATORY_CARE_PROVIDER_SITE_OTHER): Payer: 59 | Admitting: Family Medicine

## 2011-02-22 ENCOUNTER — Encounter: Payer: Self-pay | Admitting: Family Medicine

## 2011-02-22 DIAGNOSIS — M25519 Pain in unspecified shoulder: Secondary | ICD-10-CM

## 2011-02-22 DIAGNOSIS — I10 Essential (primary) hypertension: Secondary | ICD-10-CM

## 2011-02-22 DIAGNOSIS — F528 Other sexual dysfunction not due to a substance or known physiological condition: Secondary | ICD-10-CM

## 2011-02-22 DIAGNOSIS — F4322 Adjustment disorder with anxiety: Secondary | ICD-10-CM

## 2011-02-22 LAB — CBC WITH DIFFERENTIAL/PLATELET
Basophils Absolute: 0 10*3/uL (ref 0.0–0.1)
Eosinophils Absolute: 0.1 10*3/uL (ref 0.0–0.7)
HCT: 44.1 % (ref 39.0–52.0)
Hemoglobin: 15.2 g/dL (ref 13.0–17.0)
Lymphs Abs: 2 10*3/uL (ref 0.7–4.0)
MCHC: 34.4 g/dL (ref 30.0–36.0)
MCV: 96.8 fl (ref 78.0–100.0)
Monocytes Absolute: 0.5 10*3/uL (ref 0.1–1.0)
Monocytes Relative: 10.5 % (ref 3.0–12.0)
Neutro Abs: 2.1 10*3/uL (ref 1.4–7.7)
Platelets: 157 10*3/uL (ref 150.0–400.0)
RDW: 13.5 % (ref 11.5–14.6)

## 2011-02-22 LAB — BASIC METABOLIC PANEL
BUN: 19 mg/dL (ref 6–23)
Chloride: 104 mEq/L (ref 96–112)
Creatinine, Ser: 1 mg/dL (ref 0.4–1.5)
GFR: 84.68 mL/min (ref >60.00)
Glucose, Bld: 92 mg/dL (ref 70–99)
Potassium: 3.7 mEq/L (ref 3.5–5.1)

## 2011-02-22 LAB — POCT URINALYSIS DIPSTICK
Bilirubin, UA: NEGATIVE
Leukocytes, UA: NEGATIVE
Nitrite, UA: NEGATIVE
Protein, UA: NEGATIVE
pH, UA: 6.5

## 2011-02-22 LAB — LIPID PANEL
Total CHOL/HDL Ratio: 4
Triglycerides: 153 mg/dL — ABNORMAL HIGH (ref 0.0–149.0)
VLDL: 30.6 mg/dL (ref 0.0–40.0)

## 2011-02-22 LAB — PSA: PSA: 1.47 ng/mL (ref 0.10–4.00)

## 2011-02-22 LAB — HEPATIC FUNCTION PANEL
AST: 26 U/L (ref 0–37)
Albumin: 4.1 g/dL (ref 3.5–5.2)
Alkaline Phosphatase: 69 U/L (ref 39–117)
Total Bilirubin: 0.8 mg/dL (ref 0.3–1.2)

## 2011-02-22 MED ORDER — LOSARTAN POTASSIUM 50 MG PO TABS: 50.0000 mg | ORAL_TABLET | Freq: Every day | ORAL | Status: DC

## 2011-02-22 NOTE — Progress Notes (Signed)
  Subjective:    Patient ID: Michael Bradford, male    DOB: 1961-01-02, 51 y.o.   MRN: [id number]  HPI Michael Bradford is a 51 year old male who comes in today for evaluation of 3 issues  He has chronic hypertension for which she takes losartan 50 mg daily his blood pressures are normal. He says occasionally he'll wake up in the middle of the night he'll feel rapid heart rate short of breath and elevated blood pressure. He brings in blood pressures of 140/80. Normally its about 120/70.  He again is requesting a prescription for Klonopin for anxiety. I again refer him to psychiatry.  He has pain in his right shoulder. He's had surgery twice. He staking 16 and milligrams of Motrin daily. This quantity of Motrin may be what's causing occasional increase in his blood pressure. I encouraged him to take no more than 600 mg twice daily and go back and see his orthopedist for reevaluation of the right shoulder pain.   Review of Systems General and cardiovascular musculoskeletal review of systems otherwise negative    Objective:   Physical Exam Well-developed well-nourished male in no acute distress BP right arm sitting position 140/82 and he has not taken his medication this morning       Assessment & Plan:  Healthy male  Hypertension continue losartan 50 mg daily check labs  Right shoulder pain Motrin 600 mg twice a day referred back to orthopedist  Anxiety referred to psychiatry and

## 2011-02-22 NOTE — Patient Instructions (Signed)
Continue the losartan  50 mg daily with an aspirin tablet  Since your blood pressure is normal check it weekly  I would recommend no more than 600 mg of Motrin twice daily with food  If the pain persists for more than a week or 2 I would recommend you go back to  see the orthopedist for evaluation  I will call you about y  lab work back

## 2011-02-23 ENCOUNTER — Other Ambulatory Visit: Payer: Self-pay | Admitting: Family Medicine

## 2011-02-24 ENCOUNTER — Other Ambulatory Visit: Payer: Self-pay | Admitting: Family Medicine

## 2011-02-24 DIAGNOSIS — I10 Essential (primary) hypertension: Secondary | ICD-10-CM

## 2011-02-24 MED ORDER — LOSARTAN POTASSIUM 50 MG PO TABS: 50.0000 mg | ORAL_TABLET | Freq: Every day | ORAL | Status: DC

## 2011-02-24 NOTE — Telephone Encounter (Signed)
Pt would like losartan 50mg  #90 with 3 [facility] [phone]. Pt is out

## 2011-03-01 ENCOUNTER — Encounter: Payer: Self-pay | Admitting: Family Medicine

## 2011-03-01 ENCOUNTER — Ambulatory Visit (INDEPENDENT_AMBULATORY_CARE_PROVIDER_SITE_OTHER): Payer: 59 | Admitting: Family Medicine

## 2011-03-01 VITALS — BP 124/84 | Temp 98.0°F | Ht 72.5 in | Wt 214.0 lb

## 2011-03-01 DIAGNOSIS — B009 Herpesviral infection, unspecified: Secondary | ICD-10-CM

## 2011-03-01 DIAGNOSIS — I10 Essential (primary) hypertension: Secondary | ICD-10-CM

## 2011-03-01 DIAGNOSIS — N529 Male erectile dysfunction, unspecified: Secondary | ICD-10-CM

## 2011-03-01 MED ORDER — ACYCLOVIR 200 MG PO CAPS: 200.0000 mg | ORAL_CAPSULE | Freq: Every day | ORAL | Status: DC

## 2011-03-01 MED ORDER — LOSARTAN POTASSIUM 50 MG PO TABS: 50.0000 mg | ORAL_TABLET | Freq: Every day | ORAL | Status: DC

## 2011-03-01 MED ORDER — SILDENAFIL CITRATE 100 MG PO TABS: 100.0000 mg | ORAL_TABLET | ORAL | Status: DC | PRN

## 2011-03-01 NOTE — Progress Notes (Signed)
  Subjective:    Patient ID: Michael Bradford, male    DOB: 05/05/1960, 51 y.o.   MRN: [id number]  HPI Michael Bradford is a 51 year old divorced male nonsmoker who comes in today for physical examination because of a history of hypertension  He takes 50 mg of losartan daily BP 124/84 today.  A while back he was taking a lot of Motrin for shoulder pain and an elevated his blood pressure. We recommended he stop the Motrin which he did and his blood pressure went back to normal.   Review of Systems  Constitutional: Negative.   HENT: Negative.   Eyes: Negative.   Respiratory: Negative.   Cardiovascular: Negative.   Gastrointestinal: Negative.   Genitourinary: Negative.   Musculoskeletal: Negative.   Skin: Negative.   Neurological: Negative.   Hematological: Negative.   Psychiatric/Behavioral: Negative.        Objective:   Physical Exam  Constitutional: He is oriented to person, place, and time. He appears well-developed and well-nourished.  HENT:  Head: Normocephalic and atraumatic.  Right Ear: External ear normal.  Left Ear: External ear normal.  Nose: Nose normal.  Mouth/Throat: Oropharynx is clear and moist.  Eyes: Conjunctivae and EOM are normal. Pupils are equal, round, and reactive to light.  Neck: Normal range of motion. Neck supple. No JVD present. No tracheal deviation present. No thyromegaly present.  Cardiovascular: Normal rate, regular rhythm, normal heart sounds and intact distal pulses.  Exam reveals no gallop and no friction rub.   No murmur heard. Pulmonary/Chest: Effort normal and breath sounds normal. No stridor. No respiratory distress. He has no wheezes. He has no rales. He exhibits no tenderness.  Abdominal: Soft. Bowel sounds are normal. He exhibits no distension and no mass. There is no tenderness. There is no rebound and no guarding.  Genitourinary: Rectum normal, prostate normal and penis normal. Guaiac negative stool. No penile tenderness.  Musculoskeletal:  Normal range of motion. He exhibits no edema and no tenderness.  Lymphadenopathy:    He has no cervical adenopathy.  Neurological: He is alert and oriented to person, place, and time. He has normal reflexes. No cranial nerve deficit. He exhibits normal muscle tone.  Skin: Skin is warm and dry. No rash noted. No erythema. No pallor.  Psychiatric: He has a normal mood and affect. His behavior is normal. Judgment and thought content normal.          Assessment & Plan:  Healthy male  Recent right shoulder surgery healing scar  Hypertension continue losartan 50 mg daily  Continue when necessary acyclovir for HSV outbreaks  Return in one year sooner if any problems

## 2011-03-01 NOTE — Patient Instructions (Signed)
Continue your current medication  Because of your reaction to Motrin I would not take any NSAIDs in the future I would only take Tylenol or very small doses of aspirin  Return in one year or sooner if any problems

## 2011-03-02 ENCOUNTER — Telehealth: Payer: Self-pay | Admitting: Family Medicine

## 2011-03-02 NOTE — Telephone Encounter (Signed)
Left message on machine that thyroid levels are normal

## 2011-03-02 NOTE — Telephone Encounter (Signed)
Pt. has questions about his TSH reading from recent labs. Would like to speak to you about this.

## 2011-03-02 NOTE — Telephone Encounter (Signed)
Left message on machine returning patient's call 

## 2011-07-13 ENCOUNTER — Ambulatory Visit (INDEPENDENT_AMBULATORY_CARE_PROVIDER_SITE_OTHER): Payer: 59 | Admitting: Family Medicine

## 2011-07-13 ENCOUNTER — Ambulatory Visit: Payer: 59 | Admitting: Family Medicine

## 2011-07-13 ENCOUNTER — Telehealth: Payer: Self-pay | Admitting: Family Medicine

## 2011-07-13 ENCOUNTER — Encounter: Payer: Self-pay | Admitting: Family Medicine

## 2011-07-13 VITALS — BP 138/82 | Temp 98.0°F | Wt 216.0 lb

## 2011-07-13 DIAGNOSIS — G4700 Insomnia, unspecified: Secondary | ICD-10-CM

## 2011-07-13 DIAGNOSIS — F411 Generalized anxiety disorder: Secondary | ICD-10-CM

## 2011-07-13 DIAGNOSIS — F419 Anxiety disorder, unspecified: Secondary | ICD-10-CM

## 2011-07-13 MED ORDER — CLONAZEPAM 1 MG PO TABS: 1.0000 mg | ORAL_TABLET | Freq: Every evening | ORAL | Status: DC | PRN

## 2011-07-13 MED ORDER — SERTRALINE HCL 50 MG PO TABS: 50.0000 mg | ORAL_TABLET | Freq: Every day | ORAL | Status: DC

## 2011-07-13 NOTE — Progress Notes (Signed)
  Subjective:    Patient ID: Michael Bradford, male    DOB: 1960-07-13, 51 y.o.   MRN: [id number]  HPI  Acute visit. Patient seen with increased insomnia and anxiety symptoms. Increased situational stressors with recent change of job and ongoing divorce. Denies major depressive symptoms. He has daily anxiety symptoms and difficulties with sleep. Frequently has early morning awakening he cannot get back to sleep. Minimal caffeine use. He tried Advil PM and melatonin without success. Occasionally uses alcohol but usually low quantity. Tried Ambien previously without much success.   He complains of daily pervasive anxiety. Using not situational. He questions whether some of this may be related to poor sleep. Previously used Clonazepam1 mg at night and this worked well for his insomnia issues.  No hx of agitation or suspicion of bipolar.  Past Medical History  Diagnosis Date  . ALLERGIC RHINITIS   . Hypertension     eval at age 52  . Recurrent inguinal hernia     asymptomatic  . Herpes simplex   . Anxiety     situational   Past Surgical History  Procedure Date  . Wisdom tooth extraction   . Right eye trauma   . Finger surgery     times three  . Vocal polyps     times two  . Shoulder surgery 12/21/10    right    reports that he has been smoking.  He does not have any smokeless tobacco history on file. He reports that he drinks alcohol. He reports that he does not use illicit drugs. family history includes Arthritis in his mother; Diabetes in his mother; Hyperlipidemia in his brother; Hypertension in his brother and father; Obesity in his father; and Thyroid disease in his sister. No Known Allergies    Review of Systems  Constitutional: Negative for appetite change and unexpected weight change.  Cardiovascular: Negative for chest pain.  Psychiatric/Behavioral: Positive for disturbed wake/sleep cycle and dysphoric mood. Negative for suicidal ideas and agitation. The patient is  nervous/anxious.        Objective:   Physical Exam  Constitutional: He is oriented to person, place, and time. He appears well-developed and well-nourished.  Cardiovascular: Normal rate and regular rhythm.   Pulmonary/Chest: Effort normal and breath sounds normal. No respiratory distress. He has no wheezes. He has no rales.  Neurological: He is alert and oriented to person, place, and time.  Psychiatric: He has a normal mood and affect. His behavior is normal. Judgment and thought content normal.          Assessment & Plan:  #1 insomnia. Probably related to stress issues. Handout sleep hygiene given. Avoid nighttime use of alcohol. Patient is already trying multiple over-the-counter medications and Ambien previously without much success. We discussed other possible treatment such as trazodone. He is previously used clonazepam with good success and we've advised against regular use. Agreed to one refill only of clonazepam 1 mg to use sparingly to try to get back on track with sleep #2 situational anxiety/adjustment disorder. Sertraline 50 mg once daily and patient needs reassessment 3-4 weeks. Avoid long-term use of benzodiazepines. Consider counseling

## 2011-07-13 NOTE — Telephone Encounter (Signed)
Caller: Michael Bradford/Patient; PCP: Roderick Pee.; CB#: [phone];  Calling today 07/13/11 regarding Mild Anxiety; has been on Effexor in the past but he was taken off that b/c he did not like the way it made him feel.  Not able to sleep at night and having some mild anxiety.  Would like to have something mild to help with anxiety symptoms.  Emergent symptoms r/o by Anxiety guidelines with exception of new or increasing symptoms and not currently in treatment; not taking medications/therapy or change in medication or therapy.  Appt scheduled for today at 12 noon with Dr. Tawanna Cooler.

## 2011-07-13 NOTE — Telephone Encounter (Signed)
Per Dr Tawanna Cooler.  Patient to go to Psychologist for refill of medication for anxiety.  Patient was informed.

## 2011-07-13 NOTE — Patient Instructions (Addendum)

## 2011-09-02 ENCOUNTER — Other Ambulatory Visit: Payer: Self-pay | Admitting: Family Medicine

## 2011-10-17 ENCOUNTER — Other Ambulatory Visit: Payer: Self-pay | Admitting: Family Medicine

## 2011-11-12 ENCOUNTER — Other Ambulatory Visit: Payer: Self-pay | Admitting: Family Medicine

## 2011-11-29 ENCOUNTER — Telehealth: Payer: Self-pay | Admitting: Family Medicine

## 2011-11-29 NOTE — Telephone Encounter (Signed)
Pt is req to get a referral to get a colonoscopy. Pt had one done in [place] when he was 42. Pt has family hx of polyps. Pls call.

## 2011-11-29 NOTE — Telephone Encounter (Signed)
In reviewing pt history, he cancelled a screening colonscopy April 05, 2010 with Dr Jarold Motto.  I left a detailed message on pt personally identified phone of this information, explained he should not need another referral, call [facility] [phone]

## 2012-01-08 ENCOUNTER — Other Ambulatory Visit: Payer: Self-pay | Admitting: Family Medicine

## 2012-01-25 ENCOUNTER — Other Ambulatory Visit: Payer: Self-pay | Admitting: Family Medicine

## 2012-02-16 ENCOUNTER — Other Ambulatory Visit: Payer: Self-pay | Admitting: Family Medicine

## 2012-02-16 ENCOUNTER — Ambulatory Visit (INDEPENDENT_AMBULATORY_CARE_PROVIDER_SITE_OTHER): Payer: 59 | Admitting: Family Medicine

## 2012-02-16 ENCOUNTER — Encounter: Payer: Self-pay | Admitting: Family Medicine

## 2012-02-16 VITALS — BP 130/80 | Temp 99.0°F | Wt 224.0 lb

## 2012-02-16 DIAGNOSIS — N529 Male erectile dysfunction, unspecified: Secondary | ICD-10-CM

## 2012-02-16 DIAGNOSIS — J209 Acute bronchitis, unspecified: Secondary | ICD-10-CM

## 2012-02-16 MED ORDER — SILDENAFIL CITRATE 100 MG PO TABS: 100.0000 mg | ORAL_TABLET | ORAL | Status: DC | PRN

## 2012-02-16 MED ORDER — HYDROCOD POLST-CHLORPHEN POLST 10-8 MG/5ML PO LQCR: 5.0000 mL | Freq: Two times a day (BID) | ORAL | Status: DC | PRN

## 2012-02-16 MED ORDER — AZITHROMYCIN 250 MG PO TABS: ORAL_TABLET | ORAL | Status: AC

## 2012-02-16 NOTE — Patient Instructions (Signed)

## 2012-02-16 NOTE — Progress Notes (Signed)
  Subjective:    Patient ID: Michael Bradford, male    DOB: 08-04-1960, 52 y.o.   MRN: [id number]  HPI  Cough with onset around Jan 12.  Occ productive cough Using mucinex.  No fever.  No wheezing. Chronic nasal congestion.  No body aches. Ex-smoker.   No appetite or weight changes.  Review of Systems  Constitutional: Negative for fever, chills, fatigue and unexpected weight change.  HENT: Positive for congestion. Negative for sore throat and voice change.   Respiratory: Positive for cough. Negative for shortness of breath and wheezing.   Cardiovascular: Negative for chest pain.  Neurological: Negative for headaches.       Objective:   Physical Exam  Constitutional: He appears well-developed and well-nourished.  HENT:  Right Ear: External ear normal.  Left Ear: External ear normal.  Mouth/Throat: Oropharynx is clear and moist.  Neck: Neck supple.  Cardiovascular: Normal rate and regular rhythm.   Pulmonary/Chest: Effort normal and breath sounds normal. No respiratory distress. He has no wheezes. He has no rales.  Lymphadenopathy:    He has no cervical adenopathy.          Assessment & Plan:  Cough. Suspect acute viral bronchitis. We did not see indication for antibiotic at this time but patient travels frequently and is requesting Zithromax if things worsen. He plans to be out of town all next week.  limited Tussionex 1 teaspoon at night for severe cough.

## 2012-02-16 NOTE — Telephone Encounter (Signed)
Pt needs new script for sildenafil (VIAGRA) 100 MG tablet. He forgot to ask for when he had his appt. Thanks. Pharm [facility] [address]

## 2012-03-12 ENCOUNTER — Other Ambulatory Visit (INDEPENDENT_AMBULATORY_CARE_PROVIDER_SITE_OTHER): Payer: 59

## 2012-03-12 DIAGNOSIS — Z0000 Encounter for general adult medical examination without abnormal findings: Secondary | ICD-10-CM

## 2012-03-12 LAB — LIPID PANEL
Cholesterol: 244 mg/dL — ABNORMAL HIGH (ref 0–200)
HDL: 52 mg/dL (ref >39.00)
Total CHOL/HDL Ratio: 5
Triglycerides: 77 mg/dL (ref 0.0–149.0)
VLDL: 15.4 mg/dL (ref 0.0–40.0)

## 2012-03-12 LAB — POCT URINALYSIS DIPSTICK
Blood, UA: NEGATIVE
Glucose, UA: NEGATIVE
Ketones, UA: NEGATIVE
Leukocytes, UA: NEGATIVE
Nitrite, UA: NEGATIVE
Spec Grav, UA: 1.025
Urobilinogen, UA: 0.2
pH, UA: 6

## 2012-03-12 LAB — LDL CHOLESTEROL, DIRECT: Direct LDL: 162.9 mg/dL

## 2012-03-12 LAB — HEPATIC FUNCTION PANEL
ALT: 35 U/L (ref 0–53)
AST: 35 U/L (ref 0–37)
Albumin: 4.5 g/dL (ref 3.5–5.2)
Alkaline Phosphatase: 68 U/L (ref 39–117)
Bilirubin, Direct: 0.2 mg/dL (ref 0.0–0.3)
Total Bilirubin: 1.3 mg/dL — ABNORMAL HIGH (ref 0.3–1.2)
Total Protein: 7.6 g/dL (ref 6.0–8.3)

## 2012-03-12 LAB — TSH: TSH: 2.22 u[IU]/mL (ref 0.35–5.50)

## 2012-03-12 LAB — CBC WITH DIFFERENTIAL/PLATELET
Basophils Relative: 0.4 % (ref 0.0–3.0)
Eosinophils Relative: 1.7 % (ref 0.0–5.0)
Lymphocytes Relative: 27.2 % (ref 12.0–46.0)
MCV: 95 fl (ref 78.0–100.0)
Monocytes Absolute: 0.6 10*3/uL (ref 0.1–1.0)
Monocytes Relative: 13.6 % — ABNORMAL HIGH (ref 3.0–12.0)
Neutrophils Relative %: 57.1 % (ref 43.0–77.0)
Platelets: 166 10*3/uL (ref 150.0–400.0)
RBC: 5.03 Mil/uL (ref 4.22–5.81)
WBC: 4.4 10*3/uL — ABNORMAL LOW (ref 4.5–10.5)

## 2012-03-12 LAB — BASIC METABOLIC PANEL
BUN: 16 mg/dL (ref 6–23)
Calcium: 9.8 mg/dL (ref 8.4–10.5)
Creatinine, Ser: 1.2 mg/dL (ref 0.4–1.5)
GFR: 68.87 mL/min (ref >60.00)

## 2012-03-12 LAB — PSA: PSA: 1.84 ng/mL (ref 0.10–4.00)

## 2012-03-19 ENCOUNTER — Encounter: Payer: 59 | Admitting: Family Medicine

## 2012-04-06 ENCOUNTER — Ambulatory Visit (INDEPENDENT_AMBULATORY_CARE_PROVIDER_SITE_OTHER): Payer: 59 | Admitting: Family Medicine

## 2012-04-06 ENCOUNTER — Encounter: Payer: Self-pay | Admitting: Family Medicine

## 2012-04-06 VITALS — BP 140/90 | HR 80 | Temp 98.1°F | Resp 12 | Ht 72.5 in | Wt 219.0 lb

## 2012-04-06 DIAGNOSIS — Z0000 Encounter for general adult medical examination without abnormal findings: Secondary | ICD-10-CM

## 2012-04-06 MED ORDER — CLONAZEPAM 1 MG PO TABS: 1.0000 mg | ORAL_TABLET | Freq: Every evening | ORAL | Status: DC | PRN

## 2012-04-06 NOTE — Progress Notes (Signed)
Subjective:    Patient ID: Michael Bradford, male    DOB: 02-05-1960, 52 y.o.   MRN: [id number]  HPI Patient here for complete physical Past medical history significant for hypertension, intermittent insomnia Also has history of adjustment disorder with anxiety.  Blood pressure treated with losartan. Generally well-controlled.  Patient had screening colonoscopy around age 9. Apparently this was done at that age because of his father having some type of polyps. No family history of colon cancer. History of modestly elevated lipids. He still smokes but infrequently. No history of diabetes. Blood pressure generally well-controlled  Past Medical History  Diagnosis Date  . ALLERGIC RHINITIS   . Hypertension     eval at age 52  . Recurrent inguinal hernia     asymptomatic  . Herpes simplex   . Anxiety     situational   Past Surgical History  Procedure Laterality Date  . Wisdom tooth extraction    . Right eye trauma    . Finger surgery      times three  . Vocal polyps      times two  . Shoulder surgery  12/21/10    right    reports that he has been smoking.  He does not have any smokeless tobacco history on file. He reports that  drinks alcohol. He reports that he does not use illicit drugs. family history includes Arthritis in his mother; Diabetes in his mother; Heart disease (age of onset: 80) in his father; Hyperlipidemia in his brother; Hypertension in his brother and father; Obesity in his father; and Thyroid disease in his sister. No Known Allergies    Review of Systems  Constitutional: Negative for fever, activity change, appetite change, fatigue and unexpected weight change.  HENT: Negative for ear pain, congestion and trouble swallowing.   Eyes: Negative for pain and visual disturbance.  Respiratory: Negative for cough, shortness of breath and wheezing.   Cardiovascular: Negative for chest pain and palpitations.  Gastrointestinal: Negative for nausea, vomiting,  abdominal pain, diarrhea, constipation, blood in stool, abdominal distention and rectal pain.  Genitourinary: Negative for dysuria, hematuria and testicular pain.  Musculoskeletal: Negative for joint swelling and arthralgias.  Skin: Negative for rash.  Neurological: Negative for dizziness, syncope and headaches.  Hematological: Negative for adenopathy.  Psychiatric/Behavioral: Negative for confusion and dysphoric mood.       Objective:   Physical Exam  Constitutional: He is oriented to person, place, and time. He appears well-developed and well-nourished. No distress.  HENT:  Head: Normocephalic and atraumatic.  Right Ear: External ear normal.  Left Ear: External ear normal.  Mouth/Throat: Oropharynx is clear and moist.  Eyes: Conjunctivae and EOM are normal. Pupils are equal, round, and reactive to light.  Neck: Normal range of motion. Neck supple. No thyromegaly present.  Cardiovascular: Normal rate, regular rhythm and normal heart sounds.   No murmur heard. Pulmonary/Chest: No respiratory distress. He has no wheezes. He has no rales.  Abdominal: Soft. Bowel sounds are normal. He exhibits no distension and no mass. There is no tenderness. There is no rebound and no guarding.  Genitourinary: Rectum normal and prostate normal.  Musculoskeletal: He exhibits no edema.  Lymphadenopathy:    He has no cervical adenopathy.  Neurological: He is alert and oriented to person, place, and time. He displays normal reflexes. No cranial nerve deficit.  Skin: No rash noted.  Psychiatric: He has a normal mood and affect.          Assessment & Plan:  Complete physical. Patient will schedule repeat colonoscopy.  Tetanus up-to-date. Labs discussed. He has hyperlipidemia. Triglycerides and HDL improved but total cholesterol LDL increased. Discussed pros and cons of statin therapy. Patient prefers lifestyle management and consider followup lipids in about 6 months.

## 2012-04-06 NOTE — Patient Instructions (Addendum)

## 2012-05-08 ENCOUNTER — Other Ambulatory Visit: Payer: Self-pay | Admitting: Family Medicine

## 2012-07-12 ENCOUNTER — Other Ambulatory Visit: Payer: Self-pay | Admitting: Family Medicine

## 2012-07-12 NOTE — Telephone Encounter (Signed)
Rx request to pharmacy/[facility]  

## 2012-08-06 ENCOUNTER — Other Ambulatory Visit: Payer: Self-pay

## 2012-08-06 MED ORDER — ACYCLOVIR 200 MG PO CAPS: 200.0000 mg | ORAL_CAPSULE | ORAL | Status: DC | PRN

## 2012-08-06 MED ORDER — LOSARTAN POTASSIUM 50 MG PO TABS: ORAL_TABLET | ORAL | Status: DC

## 2012-08-23 ENCOUNTER — Emergency Department (HOSPITAL_COMMUNITY)
Admission: EM | Admit: 2012-08-23 | Discharge: 2012-08-24 | Disposition: A | Discharge status: Home / Self Care | Payer: 59 | Attending: Emergency Medicine | Admitting: Emergency Medicine

## 2012-08-23 ENCOUNTER — Emergency Department (HOSPITAL_COMMUNITY): Payer: 59

## 2012-08-23 ENCOUNTER — Encounter (HOSPITAL_COMMUNITY): Payer: Self-pay | Admitting: Cardiology

## 2012-08-23 DIAGNOSIS — R197 Diarrhea, unspecified: Secondary | ICD-10-CM | POA: Insufficient documentation

## 2012-08-23 DIAGNOSIS — R110 Nausea: Secondary | ICD-10-CM | POA: Insufficient documentation

## 2012-08-23 DIAGNOSIS — R1013 Epigastric pain: Secondary | ICD-10-CM | POA: Insufficient documentation

## 2012-08-23 DIAGNOSIS — Z7982 Long term (current) use of aspirin: Secondary | ICD-10-CM | POA: Insufficient documentation

## 2012-08-23 DIAGNOSIS — B009 Herpesviral infection, unspecified: Secondary | ICD-10-CM | POA: Insufficient documentation

## 2012-08-23 DIAGNOSIS — R0789 Other chest pain: Secondary | ICD-10-CM | POA: Insufficient documentation

## 2012-08-23 DIAGNOSIS — F411 Generalized anxiety disorder: Secondary | ICD-10-CM | POA: Insufficient documentation

## 2012-08-23 DIAGNOSIS — Z791 Long term (current) use of non-steroidal anti-inflammatories (NSAID): Secondary | ICD-10-CM | POA: Insufficient documentation

## 2012-08-23 DIAGNOSIS — I10 Essential (primary) hypertension: Secondary | ICD-10-CM | POA: Insufficient documentation

## 2012-08-23 DIAGNOSIS — F17200 Nicotine dependence, unspecified, uncomplicated: Secondary | ICD-10-CM | POA: Insufficient documentation

## 2012-08-23 DIAGNOSIS — Z8709 Personal history of other diseases of the respiratory system: Secondary | ICD-10-CM | POA: Insufficient documentation

## 2012-08-23 DIAGNOSIS — Z8719 Personal history of other diseases of the digestive system: Secondary | ICD-10-CM | POA: Insufficient documentation

## 2012-08-23 DIAGNOSIS — Z79899 Other long term (current) drug therapy: Secondary | ICD-10-CM | POA: Insufficient documentation

## 2012-08-23 LAB — CBC
HCT: 46.7 % (ref 39.0–52.0)
MCHC: 35.8 g/dL (ref 30.0–36.0)
Platelets: 159 10*3/uL (ref 150–400)
RDW: 12.4 % (ref 11.5–15.5)
WBC: 8.8 10*3/uL (ref 4.0–10.5)

## 2012-08-23 LAB — BASIC METABOLIC PANEL
BUN: 17 mg/dL (ref 6–23)
Creatinine, Ser: 1.02 mg/dL (ref 0.50–1.35)
GFR calc Af Amer: >90 mL/min (ref >90)
GFR calc non Af Amer: 83 mL/min — ABNORMAL LOW (ref >90)
Potassium: 4.2 mEq/L (ref 3.5–5.1)

## 2012-08-23 LAB — POCT I-STAT TROPONIN I: Troponin i, poc: 0 ng/mL (ref 0.00–0.08)

## 2012-08-23 MED ORDER — SODIUM CHLORIDE 0.9 % IV BOLUS (SEPSIS): 1000.0000 mL | Freq: Once | INTRAVENOUS | Status: DC

## 2012-08-23 MED ORDER — ONDANSETRON HCL 4 MG/2ML IJ SOLN: 4.0000 mg | Freq: Once | INTRAMUSCULAR | Status: DC

## 2012-08-23 NOTE — ED Provider Notes (Signed)
CSN: [id number]     Arrival date & time 08/23/12  1802 History     First MD Initiated Contact with Patient 08/23/12 2250     Chief Complaint  Patient presents with  . Abdominal Pain   (Consider location/radiation/quality/duration/timing/severity/associated sxs/prior Treatment) HPI HX per PT -  Traveling from [place] yesterday and states he did not feel well last night, tired and ongoing diarrhea for the last 2 weeks.  This afternoon around 3pm (about 8 hours ago), developed nausea and epigastric "indigestion" with associated chest tightness. This lasted a few hours. He felt clammy but no diaphoresis. He went to the Urgent Care and was referred here. No SOB, no F/C, no emesis, no international; travel, no known sick contacts, no RUQ pain. No blood in stools. Symptoms now minimal.  No h/o similar symptoms.  He has h/o HTN and relays normal stress about 4-5 years ago   Past Medical History  Diagnosis Date  . ALLERGIC RHINITIS   . Hypertension     eval at age 54  . Recurrent inguinal hernia     asymptomatic  . Herpes simplex   . Anxiety     situational   Past Surgical History  Procedure Laterality Date  . Wisdom tooth extraction    . Right eye trauma    . Finger surgery      times three  . Vocal polyps      times two  . Shoulder surgery  12/21/10    right   Family History  Problem Relation Age of Onset  . Arthritis Mother   . Diabetes Mother   . Hypertension Father   . Obesity Father   . Heart disease Father 66    MI  . Thyroid disease Sister   . Hyperlipidemia Brother   . Hypertension Brother    History  Substance Use Topics  . Smoking status: Current Some Day Smoker  . Smokeless tobacco: Not on file  . Alcohol Use: Yes    Review of Systems  Constitutional: Negative for fever and chills.  HENT: Negative for neck pain and neck stiffness.   Eyes: Negative for visual disturbance.  Respiratory: Negative for shortness of breath.   Cardiovascular: Positive for chest  pain.  Gastrointestinal: Negative for vomiting and abdominal pain.  Genitourinary: Negative for flank pain.  Musculoskeletal: Negative for back pain.  Skin: Negative for rash.  Neurological: Negative for headaches.  All other systems reviewed and are negative.    Allergies  Review of patient's allergies indicates no known allergies.  Home Medications   Current Outpatient Rx  Name  Route  Sig  Dispense  Refill  . acyclovir (ZOVIRAX) 200 MG capsule   Oral   Take 200 mg by mouth daily as needed (for flareups).         Marland Kitchen aspirin 325 MG tablet   Oral   Take 650 mg by mouth daily as needed for pain.         Marland Kitchen b complex vitamins tablet   Oral   Take 1 tablet by mouth daily.         Marland Kitchen doxylamine, Sleep, (UNISOM) 25 MG tablet   Oral   Take 25 mg by mouth at bedtime as needed for sleep.         . Ibuprofen-Diphenhydramine HCl (ADVIL PM) 200-25 MG CAPS   Oral   Take 2 tablets by mouth at bedtime as needed (for sleep).         . losartan (COZAAR)  50 MG tablet   Oral   Take 50 mg by mouth daily.         . Multiple Vitamin (MULTIVITAMIN) tablet   Oral   Take 1 tablet by mouth daily.           . sertraline (ZOLOFT) 50 MG tablet   Oral   Take 50 mg by mouth daily.         . Soft Lens Products (REWETTING DROPS) SOLN   Does not apply   2 drops by Does not apply route daily.          BP 139/90  Pulse 66  Temp(Src) 97.9 F (36.6 C) (Oral)  Resp 18  SpO2 98% Physical Exam  Constitutional: He is oriented to person, place, and time. He appears well-developed and well-nourished.  HENT:  Head: Normocephalic and atraumatic.  Mildly dry mm  Eyes: EOM are normal. Pupils are equal, round, and reactive to light. No scleral icterus.  Neck: Neck supple.  Cardiovascular: Normal rate, regular rhythm and intact distal pulses.   Pulmonary/Chest: Effort normal and breath sounds normal. No respiratory distress. He exhibits no tenderness.  Abdominal: Soft. Bowel sounds  are normal. He exhibits no distension. There is no tenderness. There is no rebound and no guarding.  No RUQ TTP  Musculoskeletal: Normal range of motion. He exhibits no edema.  No calf tenderness, neg Homans  Neurological: He is alert and oriented to person, place, and time.  Skin: Skin is warm and dry.    ED Course   Procedures (including critical care time)  Results for orders placed during the hospital encounter of 08/23/12  CBC      Result Value Range   WBC 8.8  4.0 - 10.5 K/uL   RBC 4.91  4.22 - 5.81 MIL/uL   Hemoglobin 16.7  13.0 - 17.0 g/dL   HCT 16.1  09.6 - 04.5 %   MCV 95.1  78.0 - 100.0 fL   MCH 34.0  26.0 - 34.0 pg   MCHC 35.8  30.0 - 36.0 g/dL   RDW 40.9  81.1 - 91.4 %   Platelets 159  150 - 400 K/uL  BASIC METABOLIC PANEL      Result Value Range   Sodium 140  135 - 145 mEq/L   Potassium 4.2  3.5 - 5.1 mEq/L   Chloride 102  96 - 112 mEq/L   CO2 27  19 - 32 mEq/L   Glucose, Bld 127 (*) 70 - 99 mg/dL   BUN 17  6 - 23 mg/dL   Creatinine, Ser 7.82  0.50 - 1.35 mg/dL   Calcium 9.6  8.4 - 95.6 mg/dL   GFR calc non Af Amer 83 (*) >90 mL/min   GFR calc Af Amer >90  >90 mL/min  HEPATIC FUNCTION PANEL      Result Value Range   Total Protein 7.2  6.0 - 8.3 g/dL   Albumin 4.0  3.5 - 5.2 g/dL   AST 26  0 - 37 U/L   ALT 23  0 - 53 U/L   Alkaline Phosphatase 66  39 - 117 U/L   Total Bilirubin 0.8  0.3 - 1.2 mg/dL   Bilirubin, Direct 0.1  0.0 - 0.3 mg/dL   Indirect Bilirubin 0.7  0.3 - 0.9 mg/dL  TROPONIN I      Result Value Range   Troponin I <0.30  <0.30 ng/mL  LIPASE, BLOOD      Result Value Range  Lipase 18  11 - 59 U/L  D-DIMER, QUANTITATIVE      Result Value Range   D-Dimer, Quant <0.27  0.00 - 0.48 ug/mL-FEU  POCT I-STAT TROPONIN I      Result Value Range   Troponin i, poc 0.00  0.00 - 0.08 ng/mL   Comment 3            Dg Chest 2 View  08/23/2012   *RADIOLOGY REPORT*  Clinical Data: Chest pain, back pain.  CHEST - 2 VIEW  Comparison: None.  Findings:  Heart and mediastinal contours are within normal limits. No focal opacities or effusions.  No acute bony abnormality.  IMPRESSION: No active cardiopulmonary disease.   Original Report Authenticated By: Charlett Nose, M.D.     Date: 08/23/2012  Rate: 64  Rhythm: normal sinus rhythm  QRS Axis: normal  Intervals: normal  ST/T Wave abnormalities: nonspecific ST changes  Conduction Disutrbances:none  Narrative Interpretation:   Old EKG Reviewed: none available  12:27 AM PT declines any IVFs/ zofran states nausea resolved and tolerates POs.  6 hour troponin, now 9 hours after onset of symptoms is neg and I discussed with PT and he prefers to be discharged home/ agrees to outpatient follow up stress testing and to return here for any return of symptoms or worsening condition.    MDM  Epigastric pain/diarrhea, chest tightness with workup as above  Evaluated with EKG, chest x-ray and labs all reviewed.  IV fluids and nausea medications offered/declined do to improved condition  Serial troponins neg  Vital signs and nursing notes reviewed and considered    Sunnie Nielsen, MD 08/25/12 [phone]

## 2012-08-23 NOTE — ED Notes (Signed)
Pt reports that he is having epigastric pain that started this afternoon around 1500. Reports some nausea, but no SOB. States some light-headedness and feeling clammy.

## 2012-08-23 NOTE — ED Notes (Signed)
Pt stated he was playing tennis yesterday and felt what he described as a "pulled muscle in the groin area" and he asked this nurse if I knew what he could do to help decrease the muscle pain. I printed him off the reference regarding "groin strain" from the list of printable references in EPIC.

## 2012-08-24 LAB — LIPASE, BLOOD: Lipase: 18 U/L (ref 11–59)

## 2012-08-24 LAB — TROPONIN I: Troponin I: <0.3 ng/mL (ref <0.30)

## 2012-08-24 LAB — HEPATIC FUNCTION PANEL
Alkaline Phosphatase: 66 U/L (ref 39–117)
Indirect Bilirubin: 0.7 mg/dL (ref 0.3–0.9)
Total Bilirubin: 0.8 mg/dL (ref 0.3–1.2)
Total Protein: 7.2 g/dL (ref 6.0–8.3)

## 2012-12-17 ENCOUNTER — Encounter: Payer: Self-pay | Admitting: Internal Medicine

## 2013-01-31 ENCOUNTER — Ambulatory Visit (AMBULATORY_SURGERY_CENTER): Payer: Self-pay | Admitting: *Deleted

## 2013-01-31 VITALS — Ht 72.0 in | Wt 225.2 lb

## 2013-01-31 DIAGNOSIS — Z1211 Encounter for screening for malignant neoplasm of colon: Secondary | ICD-10-CM

## 2013-01-31 MED ORDER — NA SULFATE-K SULFATE-MG SULF 17.5-3.13-1.6 GM/177ML PO SOLN: 1.0000 | Freq: Once | ORAL | Status: DC

## 2013-01-31 NOTE — Progress Notes (Signed)
No allergies to eggs or soy. No problems with anesthesia.  

## 2013-02-05 ENCOUNTER — Encounter: Payer: Self-pay | Admitting: Internal Medicine

## 2013-02-06 ENCOUNTER — Other Ambulatory Visit: Payer: Self-pay | Admitting: Family Medicine

## 2013-02-10 HISTORY — PX: COLONOSCOPY: SHX174

## 2013-02-14 ENCOUNTER — Encounter: Payer: Self-pay | Admitting: Internal Medicine

## 2013-02-14 ENCOUNTER — Ambulatory Visit (AMBULATORY_SURGERY_CENTER): Payer: 59 | Admitting: Internal Medicine

## 2013-02-14 VITALS — BP 111/64 | HR 60 | Temp 97.9°F | Resp 24 | Ht 72.0 in | Wt 225.0 lb

## 2013-02-14 DIAGNOSIS — Z1211 Encounter for screening for malignant neoplasm of colon: Secondary | ICD-10-CM

## 2013-02-14 MED ORDER — SODIUM CHLORIDE 0.9 % IV SOLN: 500.0000 mL | INTRAVENOUS | Status: DC

## 2013-02-14 NOTE — Op Note (Signed)
[facility] [address] [place], [id number]   COLONOSCOPY PROCEDURE REPORT  PATIENT: Michael Bradford, [name]  MR#: [id number] BIRTHDATE: [birth year] , 53  yrs. old GENDER: Male ENDOSCOPIST: Iva Booparl E Shironda Kain, MD, [facility] REFERRED AO:ZHYQMBY:Bruce Caryl NeverBurchette, M.D. PROCEDURE DATE:  02/14/2013 PROCEDURE:   Colonoscopy, screening First Screening Colonoscopy - Avg.  risk and is 50 yrs.  old or older Yes.  Prior Negative Screening - Now for repeat screening. N/A  History of Adenoma - Now for follow-up colonoscopy & has been > or = to 3 yrs.  N/A  Polyps Removed Today? No.  Recommend repeat exam, <10 yrs? No. ASA CLASS:   Class II INDICATIONS:average risk screening and first colonoscopy. MEDICATIONS: propofol (Diprivan) 500mg  IV, MAC sedation, administered by CRNA, and These medications were titrated to patient response per physician's verbal order  DESCRIPTION OF PROCEDURE:   After the risks benefits and alternatives of the procedure were thoroughly explained, informed consent was obtained.  A digital rectal exam revealed no abnormalities of the rectum, A digital rectal exam revealed no prostatic nodules, and A digital rectal exam revealed the prostate was not enlarged.   The LB [id number]  endoscope was introduced through the anus and advanced to the cecum, which was identified by both the appendix and ileocecal valve. No adverse events experienced.   The quality of the prep was excellent using Suprep  The instrument was then slowly withdrawn as the colon was fully examined.      COLON FINDINGS: A normal appearing cecum, ileocecal valve, and appendiceal orifice were identified.  The ascending, hepatic flexure, transverse, splenic flexure, descending, sigmoid colon and rectum appeared unremarkable.  No polyps or cancers were seen.   A right colon retroflexion was performed.  Retroflexed views revealed no abnormalities. The time to cecum=4 minutes 58 seconds. Withdrawal  time=8 minutes 56 seconds.  The scope was withdrawn and the procedure completed. COMPLICATIONS: There were no complications.  ENDOSCOPIC IMPRESSION: Normal colonoscopy - excellent prep - first colonoscopy  RECOMMENDATIONS: Repeat colonoscopy 10 years - 2025   eSigned:  Iva Booparl E Cathyrn Deas, MD, [facility] 02/14/2013 12:23 PM   cc: The Patient and Evelena PeatBruce Burchette, MD

## 2013-02-14 NOTE — Patient Instructions (Addendum)
The colonoscopy was normal and the prep was good. Next routine colonoscopy in 10 years - 2025  I appreciate the opportunity to care for you. Iva Booparl E. Maliaka Brasington, MD, FACG  YOU HAD AN ENDOSCOPIC PROCEDURE TODAY AT THE [facility]: Refer to the procedure report that was given to you for any specific questions about what was found during the examination.  If the procedure report does not answer your questions, please call your gastroenterologist to clarify.  If you requested that your care partner not be given the details of your procedure findings, then the procedure report has been included in a sealed envelope for you to review at your convenience later.  YOU SHOULD EXPECT: Some feelings of bloating in the abdomen. Passage of more gas than usual.  Walking can help get rid of the air that was put into your GI tract during the procedure and reduce the bloating. If you had a lower endoscopy (such as a colonoscopy or flexible sigmoidoscopy) you may notice spotting of blood in your stool or on the toilet paper. If you underwent a bowel prep for your procedure, then you may not have a normal bowel movement for a few days.  DIET: Your first meal following the procedure should be a light meal and then it is ok to progress to your normal diet.  A half-sandwich or bowl of soup is an example of a good first meal.  Heavy or fried foods are harder to digest and may make you feel nauseous or bloated.  Likewise meals heavy in dairy and vegetables can cause extra gas to form and this can also increase the bloating.  Drink plenty of fluids but you should avoid alcoholic beverages for 24 hours.  ACTIVITY: Your care partner should take you home directly after the procedure.  You should plan to take it easy, moving slowly for the rest of the day.  You can resume normal activity the day after the procedure however you should NOT DRIVE or use heavy machinery for 24 hours (because of the sedation medicines used during  the test).    SYMPTOMS TO REPORT IMMEDIATELY: A gastroenterologist can be reached at any hour.  During normal business hours, 8:30 AM to 5:00 PM Monday through Friday, call [phone].  After hours and on weekends, please call the GI answering service at [phone] who will take a message and have the physician on call contact you.   Following lower endoscopy (colonoscopy or flexible sigmoidoscopy):  Excessive amounts of blood in the stool  Significant tenderness or worsening of abdominal pains  Swelling of the abdomen that is new, acute  Fever of 100F or higher  FOLLOW UP: If any biopsies were taken you will be contacted by phone or by letter within the next 1-3 weeks.  Call your gastroenterologist if you have not heard about the biopsies in 3 weeks.  Our staff will call the home number listed on your records the next business day following your procedure to check on you and address any questions or concerns that you may have at that time regarding the information given to you following your procedure. This is a courtesy call and so if there is no answer at the home number and we have not heard from you through the emergency physician on call, we will assume that you have returned to your regular daily activities without incident.  SIGNATURES/CONFIDENTIALITY: You and/or your care partner have signed paperwork which will be entered into your electronic medical  record.  These signatures attest to the fact that that the information above on your After Visit Summary has been reviewed and is understood.  Full responsibility of the confidentiality of this discharge information lies with you and/or your care-partner. 

## 2013-02-14 NOTE — Progress Notes (Signed)
Lidocaine-40mg IV prior to Propofol InductionPropofol given over incremental dosages 

## 2013-02-15 ENCOUNTER — Telehealth: Payer: Self-pay

## 2013-02-15 NOTE — Telephone Encounter (Signed)
Called [phone] and left a message for the pt to call if any questions or concerns. maw

## 2013-04-30 ENCOUNTER — Other Ambulatory Visit: Payer: Self-pay | Admitting: Family Medicine

## 2013-05-22 ENCOUNTER — Telehealth: Payer: Self-pay | Admitting: Family Medicine

## 2013-05-22 NOTE — Telephone Encounter (Signed)
NO.  Adults rarely get mono.  No need to test if no symptoms.

## 2013-05-22 NOTE — Telephone Encounter (Signed)
Pt informed

## 2013-05-22 NOTE — Telephone Encounter (Signed)
Pt states his son was dx with mono and he wants to know if he should come in and get tested for it. Pt has an appt for labs on 5/13 and cpe on 5/20, wants to know if he should wait that long and if so what precautions should he take.

## 2013-05-29 ENCOUNTER — Other Ambulatory Visit (INDEPENDENT_AMBULATORY_CARE_PROVIDER_SITE_OTHER): Payer: 59

## 2013-05-29 DIAGNOSIS — Z0000 Encounter for general adult medical examination without abnormal findings: Secondary | ICD-10-CM

## 2013-05-29 LAB — BASIC METABOLIC PANEL
BUN: 20 mg/dL (ref 6–23)
CALCIUM: 9.1 mg/dL (ref 8.4–10.5)
CHLORIDE: 105 meq/L (ref 96–112)
CO2: 26 mEq/L (ref 19–32)
Creatinine, Ser: 1 mg/dL (ref 0.4–1.5)
GFR: 85.94 mL/min (ref >60.00)
Glucose, Bld: 85 mg/dL (ref 70–99)
Potassium: 4.2 mEq/L (ref 3.5–5.1)
Sodium: 137 mEq/L (ref 135–145)

## 2013-05-29 LAB — POCT URINALYSIS DIPSTICK
BILIRUBIN UA: NEGATIVE
Glucose, UA: NEGATIVE
Ketones, UA: NEGATIVE
Leukocytes, UA: NEGATIVE
NITRITE UA: NEGATIVE
PH UA: 6
Protein, UA: NEGATIVE
Spec Grav, UA: 1.015
Urobilinogen, UA: 0.2

## 2013-05-29 LAB — LIPID PANEL
CHOL/HDL RATIO: 4
Cholesterol: 191 mg/dL (ref 0–200)
HDL: 44.7 mg/dL (ref >39.00)
LDL Cholesterol: 128 mg/dL — ABNORMAL HIGH (ref 0–99)
Triglycerides: 90 mg/dL (ref 0.0–149.0)
VLDL: 18 mg/dL (ref 0.0–40.0)

## 2013-05-29 LAB — CBC WITH DIFFERENTIAL/PLATELET
Basophils Absolute: 0 10*3/uL (ref 0.0–0.1)
Basophils Relative: 0.4 % (ref 0.0–3.0)
Eosinophils Absolute: 0.1 10*3/uL (ref 0.0–0.7)
Eosinophils Relative: 3.1 % (ref 0.0–5.0)
HCT: 43.8 % (ref 39.0–52.0)
Hemoglobin: 15.4 g/dL (ref 13.0–17.0)
LYMPHS ABS: 1.8 10*3/uL (ref 0.7–4.0)
LYMPHS PCT: 38.3 % (ref 12.0–46.0)
MCHC: 35.1 g/dL (ref 30.0–36.0)
MCV: 94.9 fl (ref 78.0–100.0)
MONOS PCT: 10.1 % (ref 3.0–12.0)
Monocytes Absolute: 0.5 10*3/uL (ref 0.1–1.0)
NEUTROS PCT: 48.1 % (ref 43.0–77.0)
Neutro Abs: 2.3 10*3/uL (ref 1.4–7.7)
Platelets: 155 10*3/uL (ref 150.0–400.0)
RBC: 4.61 Mil/uL (ref 4.22–5.81)
RDW: 12.7 % (ref 11.5–15.5)
WBC: 4.8 10*3/uL (ref 4.0–10.5)

## 2013-05-29 LAB — HEPATIC FUNCTION PANEL
ALT: 31 U/L (ref 0–53)
AST: 39 U/L — AB (ref 0–37)
Albumin: 4.1 g/dL (ref 3.5–5.2)
Alkaline Phosphatase: 61 U/L (ref 39–117)
BILIRUBIN DIRECT: 0.1 mg/dL (ref 0.0–0.3)
BILIRUBIN TOTAL: 1 mg/dL (ref 0.2–1.2)
Total Protein: 6.6 g/dL (ref 6.0–8.3)

## 2013-05-29 LAB — TESTOSTERONE: TESTOSTERONE: 479.91 ng/dL (ref 300.00–890.00)

## 2013-05-29 LAB — TSH: TSH: 1.97 u[IU]/mL (ref 0.35–4.50)

## 2013-05-29 LAB — HIGH SENSITIVITY CRP: CRP HIGH SENSITIVITY: 0.45 mg/L (ref 0.000–5.000)

## 2013-05-29 LAB — PSA: PSA: 0.99 ng/mL (ref 0.10–4.00)

## 2013-05-29 NOTE — Addendum Note (Signed)
Addended by: Rita OharaHRASHER, Tanishia Lemaster R on: 05/29/2013 11:05 AM   Modules accepted: Orders

## 2013-05-30 ENCOUNTER — Other Ambulatory Visit: Payer: Self-pay | Admitting: Family Medicine

## 2013-06-05 ENCOUNTER — Encounter: Payer: 59 | Admitting: Family Medicine

## 2013-06-13 ENCOUNTER — Ambulatory Visit (INDEPENDENT_AMBULATORY_CARE_PROVIDER_SITE_OTHER): Payer: 59 | Admitting: Family Medicine

## 2013-06-13 ENCOUNTER — Encounter: Payer: Self-pay | Admitting: Family Medicine

## 2013-06-13 VITALS — BP 128/90 | HR 78 | Temp 98.0°F | Ht 72.0 in | Wt 224.0 lb

## 2013-06-13 DIAGNOSIS — Z0000 Encounter for general adult medical examination without abnormal findings: Secondary | ICD-10-CM

## 2013-06-13 DIAGNOSIS — R319 Hematuria, unspecified: Secondary | ICD-10-CM

## 2013-06-13 DIAGNOSIS — E669 Obesity, unspecified: Secondary | ICD-10-CM | POA: Insufficient documentation

## 2013-06-13 LAB — POCT URINALYSIS DIPSTICK
Bilirubin, UA: NEGATIVE
Blood, UA: NEGATIVE
GLUCOSE UA: NEGATIVE
LEUKOCYTES UA: NEGATIVE
NITRITE UA: NEGATIVE
Spec Grav, UA: 1.015
UROBILINOGEN UA: 1
pH, UA: 7

## 2013-06-13 MED ORDER — CLONAZEPAM 1 MG PO TABS: 1.0000 mg | ORAL_TABLET | Freq: Every evening | ORAL | Status: DC | PRN

## 2013-06-13 MED ORDER — METHYLPREDNISOLONE (PAK) 4 MG PO TABS: ORAL_TABLET | ORAL | Status: DC

## 2013-06-13 NOTE — Progress Notes (Signed)
Pre visit review using our clinic review tool, if applicable. No additional management support is needed unless otherwise documented below in the visit note. 

## 2013-06-13 NOTE — Progress Notes (Signed)
Subjective:    Patient ID: Michael Bradford, male    DOB: 1960-09-04, 53 y.o.   MRN: [id number]  HPI  Patient seen for complete physical. He has hypertension treated with losartan. He has history of anxiety treated with sertraline. Colonoscopy in the past year which was normal. He's been very active with exercise and has lost some weight which he attributes to some positive dietary changes. Nonsmoker. He's been running a lot recently without any issues.  Past Medical History  Diagnosis Date  . ALLERGIC RHINITIS   . Hypertension     eval at age 53  . Recurrent inguinal hernia     asymptomatic  . Herpes simplex   . Anxiety     situational   Past Surgical History  Procedure Laterality Date  . Wisdom tooth extraction  1976  . Right eye trauma  1975  . Finger surgery  1970, 1971    times three  . Vocal polyps  1992, 1996    times two  . Shoulder surgery  12/21/10    right    reports that he has been smoking Cigarettes.  He has been smoking about 0.00 packs per day. He has never used smokeless tobacco. He reports that he drinks about 6 ounces of alcohol per week. He reports that he does not use illicit drugs. family history includes Arthritis in his mother; Colon polyps (age of onset: 5550) in his father; Diabetes in his mother; Heart disease (age of onset: 5770) in his father; Hyperlipidemia in his brother; Hypertension in his brother and father; Obesity in his father; Thyroid disease in his sister. There is no history of Colon cancer. No Known Allergies    Review of Systems  Constitutional: Negative for fever, activity change, appetite change and fatigue.  HENT: Negative for congestion, ear pain and trouble swallowing.   Eyes: Negative for pain and visual disturbance.  Respiratory: Negative for cough, shortness of breath and wheezing.   Cardiovascular: Negative for chest pain and palpitations.  Gastrointestinal: Negative for nausea, vomiting, abdominal pain, diarrhea,  constipation, blood in stool, abdominal distention and rectal pain.  Genitourinary: Negative for dysuria, hematuria and testicular pain.  Musculoskeletal: Negative for arthralgias and joint swelling.  Skin: Negative for rash.  Neurological: Negative for dizziness, syncope and headaches.  Hematological: Negative for adenopathy.  Psychiatric/Behavioral: Negative for confusion and dysphoric mood.       Objective:   Physical Exam  Constitutional: He is oriented to person, place, and time. He appears well-developed and well-nourished. No distress.  HENT:  Head: Normocephalic and atraumatic.  Right Ear: External ear normal.  Left Ear: External ear normal.  Mouth/Throat: Oropharynx is clear and moist.  Eyes: Conjunctivae and EOM are normal. Pupils are equal, round, and reactive to light.  Neck: Normal range of motion. Neck supple. No thyromegaly present.  Cardiovascular: Normal rate, regular rhythm and normal heart sounds.   No murmur heard. Pulmonary/Chest: No respiratory distress. He has no wheezes. He has no rales.  Abdominal: Soft. Bowel sounds are normal. He exhibits no distension and no mass. There is no tenderness. There is no rebound and no guarding.  Musculoskeletal: He exhibits no edema.  Lymphadenopathy:    He has no cervical adenopathy.  Neurological: He is alert and oriented to person, place, and time. He displays normal reflexes. No cranial nerve deficit.  Skin: No rash noted.  Psychiatric: He has a normal mood and affect.          Assessment & Plan:  Complete physical. Labs reviewed with patient. He has seen improvement in terms of reduction in cholesterol. Continue regular exercise habits. Tetanus up-to-date. Colonoscopy up to date. Continue weight loss efforts.

## 2013-06-14 ENCOUNTER — Telehealth: Payer: Self-pay | Admitting: Family Medicine

## 2013-06-14 NOTE — Telephone Encounter (Signed)
Relevant patient education mailed to patient.  

## 2013-11-18 ENCOUNTER — Other Ambulatory Visit: Payer: Self-pay | Admitting: Family Medicine

## 2013-12-11 ENCOUNTER — Other Ambulatory Visit: Payer: Self-pay | Admitting: Family Medicine

## 2013-12-16 NOTE — Telephone Encounter (Signed)
Last visit 06/13/13 Last refill 06/13/13 #30 0 refill

## 2013-12-16 NOTE — Telephone Encounter (Signed)
Refill once 

## 2014-04-14 ENCOUNTER — Other Ambulatory Visit: Payer: Self-pay | Admitting: Family Medicine

## 2014-04-14 NOTE — Telephone Encounter (Signed)
Refill OK

## 2014-05-21 ENCOUNTER — Other Ambulatory Visit: Payer: Self-pay | Admitting: Family Medicine

## 2014-06-18 ENCOUNTER — Other Ambulatory Visit: Payer: Self-pay | Admitting: Family Medicine

## 2014-07-15 ENCOUNTER — Other Ambulatory Visit: Payer: Self-pay | Admitting: Family Medicine

## 2014-07-17 ENCOUNTER — Other Ambulatory Visit: Payer: Self-pay | Admitting: Family Medicine

## 2014-07-25 ENCOUNTER — Other Ambulatory Visit: Payer: Self-pay | Admitting: Family Medicine

## 2014-07-26 ENCOUNTER — Other Ambulatory Visit: Payer: Self-pay | Admitting: Family Medicine

## 2014-08-22 ENCOUNTER — Other Ambulatory Visit: Payer: Self-pay | Admitting: Family Medicine

## 2014-08-23 ENCOUNTER — Other Ambulatory Visit: Payer: Self-pay | Admitting: Family Medicine

## 2014-08-29 ENCOUNTER — Telehealth: Payer: Self-pay

## 2014-08-29 ENCOUNTER — Other Ambulatory Visit: Payer: Self-pay | Admitting: Family Medicine

## 2014-08-29 ENCOUNTER — Telehealth: Payer: Self-pay | Admitting: Family Medicine

## 2014-08-29 NOTE — Telephone Encounter (Signed)
Pt request refill of the following: sertraline (ZOLOFT) 50 MG tablet  Pt is asking if there can be refill for 6 months at a time instead of having to call every month    clonazePAM (KLONOPIN) 1 MG tablet  Pt is asking for a efill of this med. Pt said he travels so much that he takes this sometimes to help him sleep. Marland Kitchen   Pharmacy : [facility]

## 2014-08-29 NOTE — Telephone Encounter (Signed)
Pt has been scheduled for an appt

## 2014-08-29 NOTE — Telephone Encounter (Signed)
Pt has not been seen since 05/2013

## 2014-08-29 NOTE — Telephone Encounter (Signed)
Can you please call and schedule patient appointment. followup or CPE. Last visit 05/2013.

## 2014-09-01 NOTE — Telephone Encounter (Signed)
No that is fine

## 2014-09-01 NOTE — Telephone Encounter (Signed)
Someone scheduled pt for a fup on his meds.  Do you want me to call and change the appt to a cpe, or leave as is?

## 2014-09-01 NOTE — Telephone Encounter (Signed)
Pt has been sched

## 2014-09-08 ENCOUNTER — Ambulatory Visit (INDEPENDENT_AMBULATORY_CARE_PROVIDER_SITE_OTHER): Payer: 59 | Admitting: Family Medicine

## 2014-09-08 ENCOUNTER — Encounter: Payer: Self-pay | Admitting: Family Medicine

## 2014-09-08 VITALS — BP 126/80 | HR 68 | Temp 98.0°F | Wt 224.0 lb

## 2014-09-08 DIAGNOSIS — F5102 Adjustment insomnia: Secondary | ICD-10-CM

## 2014-09-08 DIAGNOSIS — F4322 Adjustment disorder with anxiety: Secondary | ICD-10-CM | POA: Diagnosis not present

## 2014-09-08 DIAGNOSIS — I10 Essential (primary) hypertension: Secondary | ICD-10-CM

## 2014-09-08 MED ORDER — CLONAZEPAM 1 MG PO TABS: 1.0000 mg | ORAL_TABLET | Freq: Every evening | ORAL | Status: DC | PRN

## 2014-09-08 MED ORDER — LOSARTAN POTASSIUM 50 MG PO TABS: 50.0000 mg | ORAL_TABLET | Freq: Every day | ORAL | Status: DC

## 2014-09-08 MED ORDER — SERTRALINE HCL 50 MG PO TABS: 50.0000 mg | ORAL_TABLET | Freq: Every day | ORAL | Status: DC

## 2014-09-08 NOTE — Progress Notes (Signed)
Pre visit review using our clinic review tool, if applicable. No additional management support is needed unless otherwise documented below in the visit note. 

## 2014-09-08 NOTE — Progress Notes (Signed)
   Subjective:    Patient ID: Michael Bradford, male    DOB: 1960-02-07, 54 y.o.   MRN: [id number]  HPI  patient seen for medical follow-up. He has history of hypertension treated with losartan 50 mg once daily. No recent headaches. No dizziness. No chest pains. Exercises fairly frequent. Needs refills of medication.   Blood pressures been well controlled at this dosage.   History of adjustment disorder with anxiety. He takes chronic sertraline 50 mg daily and that has been working well for him. Requesting refills. Denies any side effects. Denies depressed mood.   Transient insomnia. His job requires frequent travel. He takes Klonopin occasionally as needed and uses very infrequently.  Past Medical History  Diagnosis Date  . ALLERGIC RHINITIS   . Hypertension     eval at age 70  . Recurrent inguinal hernia     asymptomatic  . Herpes simplex   . Anxiety     situational   Past Surgical History  Procedure Laterality Date  . Wisdom tooth extraction  1976  . Right eye trauma  1975  . Finger surgery  1970, 1971    times three  . Vocal polyps  1992, 1996    times two  . Shoulder surgery  12/21/10    right    reports that he has been smoking Cigarettes.  He has never used smokeless tobacco. He reports that he drinks about 6.0 oz of alcohol per week. He reports that he does not use illicit drugs. family history includes Arthritis in his mother; Colon polyps (age of onset: 96) in his father; Diabetes in his mother; Heart disease (age of onset: 3) in his father; Hyperlipidemia in his brother; Hypertension in his brother and father; Obesity in his father; Thyroid disease in his sister. There is no history of Colon cancer. No Known Allergies    Review of Systems  Constitutional: Negative for fatigue and unexpected weight change.  Eyes: Negative for visual disturbance.  Respiratory: Negative for cough, chest tightness and shortness of breath.   Cardiovascular: Negative for chest pain,  palpitations and leg swelling.  Endocrine: Negative for polydipsia and polyuria.  Neurological: Negative for dizziness, syncope, weakness, light-headedness and headaches.       Objective:   Physical Exam  Constitutional: He appears well-developed and well-nourished. No distress.  Neck: Neck supple. No thyromegaly present.  Cardiovascular: Normal rate and regular rhythm.   No murmur heard. Pulmonary/Chest: Effort normal and breath sounds normal. No respiratory distress. He has no wheezes. He has no rales.  Musculoskeletal: He exhibits no edema.  Lymphadenopathy:    He has no cervical adenopathy.  Psychiatric: He has a normal mood and affect. His behavior is normal.          Assessment & Plan:   #1 hypertension stable and at goal. Refill losartan for one year #2 history of transient insomnia. Sleep hygiene discussed. Refill Klonopin for rare infrequent use with travel  #3 history of chronic anxiety. Refill sertraline 50 mg once daily for one year.  #4 health maintenance. We have recommended complete physical and he will consider scheduling at some point later this year

## 2014-09-10 ENCOUNTER — Other Ambulatory Visit: Payer: Self-pay | Admitting: Family Medicine

## 2014-11-04 ENCOUNTER — Telehealth: Payer: Self-pay | Admitting: Family Medicine

## 2014-11-04 NOTE — Telephone Encounter (Signed)
Pt states he is applying for like insurance and the report to them stated occasional smoker. Pt states he has not smoked since 12/2012.  Pt states he never mentioned his status had changed because he id not realize it would show up on a report.   Pt is being penalized for this and wants to know if this can be changed in his record and resubmitted.  Pt will pick up and send to them if possible

## 2014-11-05 NOTE — Telephone Encounter (Signed)
Time wise a letter would probably be best suited to meet the patients needs. At his next office visit the history can be amended so that it reflects December 2014 going forward. Let me know if I can be of assistance.

## 2014-11-05 NOTE — Telephone Encounter (Signed)
How do we go about amending his record that he has not smoked since 12/14?  I could just type out a letter stating the above, but patient inquiring about amending the record.

## 2014-11-09 NOTE — Telephone Encounter (Signed)
Letter has been dictated.

## 2014-11-10 NOTE — Telephone Encounter (Signed)
Called patient and left a message that letter was ready to be picked up.

## 2015-05-11 ENCOUNTER — Other Ambulatory Visit: Payer: Self-pay | Admitting: Family Medicine

## 2015-05-20 ENCOUNTER — Ambulatory Visit (INDEPENDENT_AMBULATORY_CARE_PROVIDER_SITE_OTHER): Payer: 59 | Admitting: Family Medicine

## 2015-05-20 ENCOUNTER — Encounter: Payer: Self-pay | Admitting: Family Medicine

## 2015-05-20 VITALS — BP 130/82 | HR 93 | Temp 97.8°F | Ht 72.0 in | Wt 224.3 lb

## 2015-05-20 DIAGNOSIS — Z23 Encounter for immunization: Secondary | ICD-10-CM

## 2015-05-20 DIAGNOSIS — Z0000 Encounter for general adult medical examination without abnormal findings: Secondary | ICD-10-CM | POA: Diagnosis not present

## 2015-05-20 DIAGNOSIS — M1612 Unilateral primary osteoarthritis, left hip: Secondary | ICD-10-CM

## 2015-05-20 LAB — CBC WITH DIFFERENTIAL/PLATELET
BASOS PCT: 0.4 % (ref 0.0–3.0)
Basophils Absolute: 0 10*3/uL (ref 0.0–0.1)
EOS ABS: 0.1 10*3/uL (ref 0.0–0.7)
EOS PCT: 2.3 % (ref 0.0–5.0)
HEMATOCRIT: 44.8 % (ref 39.0–52.0)
HEMOGLOBIN: 15.4 g/dL (ref 13.0–17.0)
LYMPHS PCT: 26 % (ref 12.0–46.0)
Lymphs Abs: 1.5 10*3/uL (ref 0.7–4.0)
MCHC: 34.4 g/dL (ref 30.0–36.0)
MCV: 94.1 fl (ref 78.0–100.0)
MONOS PCT: 8.1 % (ref 3.0–12.0)
Monocytes Absolute: 0.5 10*3/uL (ref 0.1–1.0)
NEUTROS ABS: 3.7 10*3/uL (ref 1.4–7.7)
Neutrophils Relative %: 63.2 % (ref 43.0–77.0)
Platelets: 170 10*3/uL (ref 150.0–400.0)
RBC: 4.76 Mil/uL (ref 4.22–5.81)
RDW: 13 % (ref 11.5–15.5)
WBC: 5.9 10*3/uL (ref 4.0–10.5)

## 2015-05-20 LAB — LIPID PANEL
Cholesterol: 225 mg/dL — ABNORMAL HIGH (ref 0–200)
HDL: 47.8 mg/dL (ref >39.00)
LDL Cholesterol: 153 mg/dL — ABNORMAL HIGH (ref 0–99)
NonHDL: 177.65
Total CHOL/HDL Ratio: 5
Triglycerides: 122 mg/dL (ref 0.0–149.0)
VLDL: 24.4 mg/dL (ref 0.0–40.0)

## 2015-05-20 LAB — HEPATIC FUNCTION PANEL
ALT: 20 U/L (ref 0–53)
AST: 25 U/L (ref 0–37)
Albumin: 4.6 g/dL (ref 3.5–5.2)
Alkaline Phosphatase: 79 U/L (ref 39–117)
BILIRUBIN TOTAL: 0.8 mg/dL (ref 0.2–1.2)
Bilirubin, Direct: 0.1 mg/dL (ref 0.0–0.3)
TOTAL PROTEIN: 7.2 g/dL (ref 6.0–8.3)

## 2015-05-20 LAB — BASIC METABOLIC PANEL
BUN: 17 mg/dL (ref 6–23)
CALCIUM: 9.6 mg/dL (ref 8.4–10.5)
CO2: 25 mEq/L (ref 19–32)
Chloride: 105 mEq/L (ref 96–112)
Creatinine, Ser: 0.92 mg/dL (ref 0.40–1.50)
GFR: 90.68 mL/min (ref >60.00)
GLUCOSE: 100 mg/dL — AB (ref 70–99)
Potassium: 4 mEq/L (ref 3.5–5.1)
SODIUM: 139 meq/L (ref 135–145)

## 2015-05-20 LAB — TSH: TSH: 2.3 u[IU]/mL (ref 0.35–4.50)

## 2015-05-20 LAB — PSA: PSA: 1.46 ng/mL (ref 0.10–4.00)

## 2015-05-20 MED ORDER — PREDNISONE 10 MG PO TABS: ORAL_TABLET | ORAL | Status: DC

## 2015-05-20 MED ORDER — CLONAZEPAM 1 MG PO TABS: 1.0000 mg | ORAL_TABLET | Freq: Every evening | ORAL | Status: DC | PRN

## 2015-05-20 NOTE — Progress Notes (Signed)
Pre visit review using our clinic review tool, if applicable. No additional management support is needed unless otherwise documented below in the visit note. 

## 2015-05-20 NOTE — Progress Notes (Signed)
Subjective:    Patient ID: Michael Bradford, male    DOB: 01-14-1961, 55 y.o.   MRN: [id number]  HPI  Patient seen for complete physical.  Quit smoking back in December 2015.  Hypertension treated with losartan. This has been well controlled.  Osteoarthritis left hip. Looking at upcoming hip replacement sometime later this year.   Last tetanus reportedly over 10 years ago.  colonoscopy up-to-date.  Exercise has been limited because of his hip issues.  Past Medical History  Diagnosis Date  . ALLERGIC RHINITIS   . Hypertension     eval at age 55  . Recurrent inguinal hernia     asymptomatic  . Herpes simplex   . Anxiety     situational   Past Surgical History  Procedure Laterality Date  . Wisdom tooth extraction  1976  . Right eye trauma  1975  . Finger surgery  1970, 1971    times three  . Vocal polyps  1992, 1996    times two  . Shoulder surgery  12/21/10    right    reports that he quit smoking about 16 months ago. His smoking use included Cigarettes. He has never used smokeless tobacco. He reports that he drinks about 6.0 oz of alcohol per week. He reports that he does not use illicit drugs. family history includes Arthritis in his mother; Colon polyps (age of onset: 7050) in his father; Diabetes in his mother; Heart disease (age of onset: 2170) in his father; Hyperlipidemia in his brother; Hypertension in his brother and father; Obesity in his father; Thyroid disease in his sister. There is no history of Colon cancer. No Known Allergies'   Review of Systems  Constitutional: Negative for fever, activity change, appetite change, fatigue and unexpected weight change.  HENT: Negative for congestion, ear pain and trouble swallowing.   Eyes: Negative for pain and visual disturbance.  Respiratory: Negative for cough, shortness of breath and wheezing.   Cardiovascular: Negative for chest pain and palpitations.  Gastrointestinal: Negative for nausea, vomiting, abdominal pain,  diarrhea, constipation, blood in stool, abdominal distention and rectal pain.  Endocrine: Negative for polydipsia and polyuria.  Genitourinary: Negative for dysuria, hematuria and testicular pain.  Musculoskeletal: Positive for arthralgias ( Left hip). Negative for joint swelling.  Skin: Negative for rash.  Neurological: Negative for dizziness, syncope and headaches.  Hematological: Negative for adenopathy.  Psychiatric/Behavioral: Negative for confusion and dysphoric mood.       Objective:   Physical Exam  Constitutional: He is oriented to person, place, and time. He appears well-developed and well-nourished. No distress.  HENT:  Head: Normocephalic and atraumatic.  Right Ear: External ear normal.  Left Ear: External ear normal.  Mouth/Throat: Oropharynx is clear and moist.  Eyes: Conjunctivae and EOM are normal. Pupils are equal, round, and reactive to light.  Neck: Normal range of motion. Neck supple. No thyromegaly present.  Cardiovascular: Normal rate, regular rhythm and normal heart sounds.   No murmur heard. Pulmonary/Chest: No respiratory distress. He has no wheezes. He has no rales.  Abdominal: Soft. Bowel sounds are normal. He exhibits no distension and no mass. There is no tenderness. There is no rebound and no guarding.  Genitourinary: Rectum normal and prostate normal.  Musculoskeletal: He exhibits no edema.  Lymphadenopathy:    He has no cervical adenopathy.  Neurological: He is alert and oriented to person, place, and time. He displays normal reflexes. No cranial nerve deficit.  Skin: No rash noted.  Psychiatric: He  has a normal mood and affect.          Assessment & Plan:  Physical exam. Tetanus booster given. Congratulated on quitting smoking.   ordered repeat screening labs.   Colonoscopy up-to-date. Hopefully can step up his exercise after hip surgery  Kristian Covey MD [facility]

## 2015-05-20 NOTE — Addendum Note (Signed)
Addended by: Tempie HoistMCNEIL, AUTUMN M on: 05/20/2015 10:55 AM   Modules accepted: Orders, SmartSet

## 2015-06-12 ENCOUNTER — Encounter: Payer: 59 | Admitting: Family Medicine

## 2015-06-20 ENCOUNTER — Ambulatory Visit: Payer: Self-pay | Admitting: Orthopedic Surgery

## 2015-06-20 NOTE — Progress Notes (Signed)
Preoperative surgical orders have been place into the Epic hospital system for Michael Bradford on 06/20/2015, 1:09 PM  by Patrica DuelPERKINS, ALEXZANDREW for surgery on 07/08/2015.  Preop Total Hip - Anterior Approach orders including IV Tylenol, and IV Decadron as long as there are no contraindications to the above medications. Michael Peacerew Perkins, PA-C

## 2015-06-22 ENCOUNTER — Other Ambulatory Visit (HOSPITAL_COMMUNITY): Payer: Self-pay | Admitting: *Deleted

## 2015-06-22 NOTE — Patient Instructions (Addendum)
Michael Bradford  06/22/2015   Your procedure is scheduled on: 07-08-15  [facility]  [facility] to 3rd floor to  Short Stay Center at 1145PM Call this number if you have problems the morning of surgery [phone]   Remember: ONLY 1 PERSON MAY GO WITH YOU TO SHORT STAY TO GET  READY MORNING OF YOUR SURGERY.  Do not eat food  :After Midnight, MAY HAVE CLEAR LIQUIDS FROM MIDNIGHT UNTIL 730 AM DAY OF SURGERY, NOTHING BY MOUTH AFTER 730 AM DAY OF SURGERY  SHORT  STAY WILL DRAW YOUR BLOOD TYPE MORNING OF SURGERY   Take these medicines the morning of surgery with A SIP OF WATER: SERTRALINE (ZOLOFT). EYE DROP, CLONAZEPAM IF NEEDED                                You may not have any metal on your body including hair pins and              piercings  Do not wear jewelry, make-up, lotions, powders or perfumes, deodorant             Do not wear nail polish.  Do not shave  48 hours prior to surgery.              Men may shave face and neck.   Do not bring valuables to the hospital. [facility] IS NOT             RESPONSIBLE   FOR VALUABLES.  Contacts, dentures or bridgework may not be worn into surgery.  Leave suitcase in the car. After surgery it may be brought to your room.     Patients discharged the day of surgery will not be allowed to drive home.  Name and phone number of your driver:  Special Instructions: N/A              Please read over the following fact sheets you were given: _____________________________________________________________________                CLEAR LIQUID DIET   Foods Allowed                                                                     Foods Excluded  Coffee and tea, regular and decaf                             liquids that you cannot  Plain Jell-O in any flavor                                             see through such as: Fruit ices (not with fruit pulp)                                      milk, soups, orange  juice  Iced Popsicles                                    All solid food Carbonated beverages, regular and diet                                    Cranberry, grape and apple juices Sports drinks like Gatorade Lightly seasoned clear broth or consume(fat free) Sugar, honey syrup  Sample Menu Breakfast                                Lunch                                     Supper Cranberry juice                    Beef broth                            Chicken broth Jell-O                                     Grape juice                           Apple juice Coffee or tea                        Jell-O                                      Popsicle                                                Coffee or tea                        Coffee or tea  _____________________________________________________________________  [facility] - Preparing for Surgery Before surgery, you can play an important role.  Because skin is not sterile, your skin needs to be as free of germs as possible.  You can reduce the number of germs on your skin by washing with CHG (chlorahexidine gluconate) soap before surgery.  CHG is an antiseptic cleaner which kills germs and bonds with the skin to continue killing germs even after washing. Please DO NOT use if you have an allergy to CHG or antibacterial soaps.  If your skin becomes reddened/irritated stop using the CHG and inform your nurse when you arrive at Short Stay. Do not shave (including legs and underarms) for at least 48 hours prior to the first CHG shower.  You may shave your face/neck. Please follow these instructions carefully:  1.  Shower with CHG Soap the night before surgery and the  morning of Surgery.  2.  If you choose to wash your hair, wash  your hair first as usual with your  normal  shampoo.  3.  After you shampoo, rinse your hair and body thoroughly to remove the  shampoo.                           4.  Use CHG as you would any other  liquid soap.  You can apply chg directly  to the skin and wash                       Gently with a scrungie or clean washcloth.  5.  Apply the CHG Soap to your body ONLY FROM THE NECK DOWN.   Do not use on face/ open                           Wound or open sores. Avoid contact with eyes, ears mouth and genitals (private parts).                       Wash face,  Genitals (private parts) with your normal soap.             6.  Wash thoroughly, paying special attention to the area where your surgery  will be performed.  7.  Thoroughly rinse your body with warm water from the neck down.  8.  DO NOT shower/wash with your normal soap after using and rinsing off  the CHG Soap.                9.  Pat yourself dry with a clean towel.            10.  Wear clean pajamas.            11.  Place clean sheets on your bed the night of your first shower and do not  sleep with pets. Day of Surgery : Do not apply any lotions/deodorants the morning of surgery.  Please wear clean clothes to the hospital/surgery center.  FAILURE TO FOLLOW THESE INSTRUCTIONS MAY RESULT IN THE CANCELLATION OF YOUR SURGERY PATIENT SIGNATURE_________________________________  NURSE SIGNATURE__________________________________  ________________________________________________________________________   Michael Bradford  An incentive spirometer is a tool that can help keep your lungs clear and active. This tool measures how well you are filling your lungs with each breath. Taking long deep breaths may help reverse or decrease the chance of developing breathing (pulmonary) problems (especially infection) following:  A long period of time when you are unable to move or be active. BEFORE THE PROCEDURE   If the spirometer includes an indicator to show your best effort, your nurse or respiratory therapist will set it to a desired goal.  If possible, sit up straight or lean slightly forward. Try not to slouch.  Hold the incentive  spirometer in an upright position. INSTRUCTIONS FOR USE   Sit on the edge of your bed if possible, or sit up as far as you can in bed or on a chair.  Hold the incentive spirometer in an upright position.  Breathe out normally.  Place the mouthpiece in your mouth and seal your lips tightly around it.  Breathe in slowly and as deeply as possible, raising the piston or the ball toward the top of the column.  Hold your breath for 3-5 seconds or for as long as possible. Allow the piston or ball to fall to the bottom of the column.  Remove the mouthpiece from your mouth and breathe out normally.  Rest for a few seconds and repeat Steps 1 through 7 at least 10 times every 1-2 hours when you are awake. Take your time and take a few normal breaths between deep breaths.  The spirometer may include an indicator to show your best effort. Use the indicator as a goal to work toward during each repetition.  After each set of 10 deep breaths, practice coughing to be sure your lungs are clear. If you have an incision (the cut made at the time of surgery), support your incision when coughing by placing a pillow or rolled up towels firmly against it. Once you are able to get out of bed, walk around indoors and cough well. You may stop using the incentive spirometer when instructed by your caregiver.  RISKS AND COMPLICATIONS  Take your time so you do not get dizzy or light-headed.  If you are in pain, you may need to take or ask for pain medication before doing incentive spirometry. It is harder to take a deep breath if you are having pain. AFTER USE  Rest and breathe slowly and easily.  It can be helpful to keep track of a log of your progress. Your caregiver can provide you with a simple table to help with this. If you are using the spirometer at home, follow these instructions: SEEK MEDICAL CARE IF:   You are having difficultly using the spirometer.  You have trouble using the spirometer as  often as instructed.  Your pain medication is not giving enough relief while using the spirometer.  You develop fever of 100.5 F (38.1 C) or higher. SEEK IMMEDIATE MEDICAL CARE IF:   You cough up bloody sputum that had not been present before.  You develop fever of 102 F (38.9 C) or greater.  You develop worsening pain at or near the incision site. MAKE SURE YOU:   Understand these instructions.  Will watch your condition.  Will get help right away if you are not doing well or get worse. Document Released: 05/16/2006 Document Revised: 03/28/2011 Document Reviewed: 07/17/2006 Exit[employer] Patient Information 2014 Exit[employer], [place].   ________________________________________________________________________  WHAT IS A BLOOD TRANSFUSION? Blood Transfusion Information  A transfusion is the replacement of blood or some of its parts. Blood is made up of multiple cells which provide different functions.  Red blood cells carry oxygen and are used for blood loss replacement.  White blood cells fight against infection.  Platelets control bleeding.  Plasma helps clot blood.  Other blood products are available for specialized needs, such as hemophilia or other clotting disorders. BEFORE THE TRANSFUSION  Who gives blood for transfusions?   Healthy volunteers who are fully evaluated to make sure their blood is safe. This is blood bank blood. Transfusion therapy is the safest it has ever been in the practice of medicine. Before blood is taken from a donor, a complete history is taken to make sure that person has no history of diseases nor engages in risky social behavior (examples are intravenous drug use or sexual activity with multiple partners). The donor's travel history is screened to minimize risk of transmitting infections, such as malaria. The donated blood is tested for signs of infectious diseases, such as HIV and hepatitis. The blood is then tested to be sure it is compatible with  you in order to minimize the chance of a transfusion reaction. If you or a relative donates blood, this is often done in anticipation  of surgery and is not appropriate for emergency situations. It takes many days to process the donated blood. RISKS AND COMPLICATIONS Although transfusion therapy is very safe and saves many lives, the main dangers of transfusion include:   Getting an infectious disease.  Developing a transfusion reaction. This is an allergic reaction to something in the blood you were given. Every precaution is taken to prevent this. The decision to have a blood transfusion has been considered carefully by your caregiver before blood is given. Blood is not given unless the benefits outweigh the risks. AFTER THE TRANSFUSION  Right after receiving a blood transfusion, you will usually feel much better and more energetic. This is especially true if your red blood cells have gotten low (anemic). The transfusion raises the level of the red blood cells which carry oxygen, and this usually causes an energy increase.  The nurse administering the transfusion will monitor you carefully for complications. HOME CARE INSTRUCTIONS  No special instructions are needed after a transfusion. You may find your energy is better. Speak with your caregiver about any limitations on activity for underlying diseases you may have. SEEK MEDICAL CARE IF:   Your condition is not improving after your transfusion.  You develop redness or irritation at the intravenous (IV) site. SEEK IMMEDIATE MEDICAL CARE IF:  Any of the following symptoms occur over the next 12 hours:  Shaking chills.  You have a temperature by mouth above 102 F (38.9 C), not controlled by medicine.  Chest, back, or muscle pain.  People around you feel you are not acting correctly or are confused.  Shortness of breath or difficulty breathing.  Dizziness and fainting.  You get a rash or develop hives.  You have a decrease in  urine output.  Your urine turns a dark color or changes to pink, red, or brown. Any of the following symptoms occur over the next 10 days:  You have a temperature by mouth above 102 F (38.9 C), not controlled by medicine.  Shortness of breath.  Weakness after normal activity.  The white part of the eye turns yellow (jaundice).  You have a decrease in the amount of urine or are urinating less often.  Your urine turns a dark color or changes to pink, red, or brown. Document Released: 01/01/2000 Document Revised: 03/28/2011 Document Reviewed: 08/20/2007 [employer] Patient Information 2014 [place], [place].  _______________________________________________________________________

## 2015-06-24 ENCOUNTER — Encounter (INDEPENDENT_AMBULATORY_CARE_PROVIDER_SITE_OTHER): Payer: Self-pay

## 2015-06-24 ENCOUNTER — Encounter (HOSPITAL_COMMUNITY): Payer: Self-pay

## 2015-06-24 ENCOUNTER — Encounter (HOSPITAL_COMMUNITY)
Admission: RE | Admit: 2015-06-24 | Discharge: 2015-06-24 | Disposition: A | Discharge status: Home / Self Care | Payer: 59 | Source: Ambulatory Visit | Attending: Orthopedic Surgery | Admitting: Orthopedic Surgery

## 2015-06-24 DIAGNOSIS — Z01812 Encounter for preprocedural laboratory examination: Secondary | ICD-10-CM | POA: Diagnosis not present

## 2015-06-24 DIAGNOSIS — Z0183 Encounter for blood typing: Secondary | ICD-10-CM | POA: Insufficient documentation

## 2015-06-24 DIAGNOSIS — M1612 Unilateral primary osteoarthritis, left hip: Secondary | ICD-10-CM | POA: Insufficient documentation

## 2015-06-24 DIAGNOSIS — Z01818 Encounter for other preprocedural examination: Secondary | ICD-10-CM | POA: Insufficient documentation

## 2015-06-24 LAB — COMPREHENSIVE METABOLIC PANEL
ALBUMIN: 4.5 g/dL (ref 3.5–5.0)
ALT: 29 U/L (ref 17–63)
AST: 36 U/L (ref 15–41)
Alkaline Phosphatase: 69 U/L (ref 38–126)
Anion gap: 6 (ref 5–15)
BUN: 15 mg/dL (ref 6–20)
CHLORIDE: 106 mmol/L (ref 101–111)
CO2: 27 mmol/L (ref 22–32)
CREATININE: 1.07 mg/dL (ref 0.61–1.24)
Calcium: 9.2 mg/dL (ref 8.9–10.3)
GFR calc Af Amer: >60 mL/min (ref >60)
GLUCOSE: 98 mg/dL (ref 65–99)
Potassium: 3.7 mmol/L (ref 3.5–5.1)
Sodium: 139 mmol/L (ref 135–145)
Total Bilirubin: 1.1 mg/dL (ref 0.3–1.2)
Total Protein: 7.2 g/dL (ref 6.5–8.1)

## 2015-06-24 LAB — SURGICAL PCR SCREEN
MRSA, PCR: NEGATIVE
STAPHYLOCOCCUS AUREUS: NEGATIVE

## 2015-06-24 LAB — URINALYSIS, ROUTINE W REFLEX MICROSCOPIC
Bilirubin Urine: NEGATIVE
GLUCOSE, UA: NEGATIVE mg/dL
Ketones, ur: NEGATIVE mg/dL
LEUKOCYTES UA: NEGATIVE
NITRITE: NEGATIVE
PH: 7 (ref 5.0–8.0)
Protein, ur: NEGATIVE mg/dL
SPECIFIC GRAVITY, URINE: 1.01 (ref 1.005–1.030)

## 2015-06-24 LAB — URINE MICROSCOPIC-ADD ON: SQUAMOUS EPITHELIAL / LPF: NONE SEEN

## 2015-06-24 LAB — CBC
HEMATOCRIT: 45.2 % (ref 39.0–52.0)
Hemoglobin: 15.7 g/dL (ref 13.0–17.0)
MCH: 32.2 pg (ref 26.0–34.0)
MCHC: 34.7 g/dL (ref 30.0–36.0)
MCV: 92.8 fL (ref 78.0–100.0)
PLATELETS: 162 10*3/uL (ref 150–400)
RBC: 4.87 MIL/uL (ref 4.22–5.81)
RDW: 12.5 % (ref 11.5–15.5)
WBC: 4.5 10*3/uL (ref 4.0–10.5)

## 2015-06-24 LAB — APTT: APTT: 33 s (ref 24–37)

## 2015-06-24 LAB — PROTIME-INR
INR: 1.17 (ref 0.00–1.49)
PROTHROMBIN TIME: 15.1 s (ref 11.6–15.2)

## 2015-06-24 NOTE — Progress Notes (Signed)
MICRO AND UA RESULTS FAXED TO DR Lequita HaltALUISIO BY EPIC

## 2015-06-24 NOTE — Progress Notes (Signed)
Micro, ua results faxed by epic to dr olin 

## 2015-06-25 NOTE — Progress Notes (Signed)
SPOKE WITH PT BY PHONE PT AWARE SURGERY TIME CHANGED TO 1600, ARRIVE 1300 [facility] SHORT STAY 07-08-15 , CLEAR LIQUIDS FROM MIDNIGHT UNTIL 1000 AM DAY OF SURGERY., NOTHING BY MOUTH AFTER 1000 AM DAY OF SURGERY.

## 2015-07-03 NOTE — Progress Notes (Signed)
LEFT MESSAGE AND PT RETURNED CALL AWARE TO ARRIVE 1200 PM FOR 1500 SURGERY 07-08-15, CLEAR LIQUIDS FROM MIDNIGHT UNTIL 900 AM, THEN NOTHING BY MOUTH AFTER 900 AM DAY OF SURGERY.

## 2015-07-05 ENCOUNTER — Ambulatory Visit: Payer: Self-pay | Admitting: Orthopedic Surgery

## 2015-07-05 NOTE — H&P (Signed)
Michael Bradford DOB: 06/24/1960 Married / Language: English / Race: White Male Date of Admission:  07/08/2015 CC:  Left Hip Pain History of Present Illness The patient is a 55 year old male who comes infor a preoperative History and Physical. The patient is scheduled for a left total hip arthroplasty (anterior) to be performed by Dr. Frank V. Aluisio, MD at [facility] on 07-08-2015. The patient is being followed for their left osteoarthritis. They are months out from IA injection. Symptoms reported include: pain (pt. is having popping with his right knee.). The patient feels that they are doing poorly and report their pain level to be moderate to severe (weightbearing). Current treatment includes: NSAIDs (Meloxicam). The following medication has been used for pain control: antiinflammatory medication. The patient has not gotten any relief of their symptoms with Cortisone injections (IA injection did not help). Rosie would like to proceed with surgery at this time and undergo hip replacement surgery. He states the hip has gotten much worse over time. He has pain with most activities now. It is limiting what he can and cannot do. He has tried injections and therapy but continuess to have pain. They have been treated conservatively in the past for the above stated problem and despite conservative measures, they continue to have progressive pain and severe functional limitations and dysfunction. They have failed non-operative management including home exercise, medications, and injections. It is felt that they would benefit from undergoing total joint replacement. Risks and benefits of the procedure have been discussed with the patient and they elect to proceed with surgery. There are no active contraindications to surgery such as ongoing infection or rapidly progressive neurological disease.  Problem List/Past Medical  Strain of flexor muscle of left hip, subsequent encounter (S76.012D)   Osteoarthritis of left hip, unspecified osteoarthritis type (M16.12)  Dislocation closed interphalangeal hand (834.02) [05/22/2006]: Anxiety Disorder  High blood pressure  Allergies No Known Drug Allergies   Family History Heart Disease  Father. Hypertension  Father. Rheumatoid Arthritis  Mother.  Social History Tobacco use  Former smoker. 03/06/2014: smoke(d) less than 1/2 pack(s) per day Children  2 Current drinker  03/06/2014: Currently drinks wine 5-7 times per week Current work status  working full time Exercise  Exercises daily; does running / walking, individual sport, gym / weights and team sport Living situation  live with spouse Marital status  married No history of drug/alcohol rehab  Not under pain contract  Number of flights of stairs before winded  greater than 5 Tobacco / smoke exposure  03/06/2014: no Plan - HOME  Medication History Losartan Potassium (50MG Tablet, Oral) Active. Sertraline HCl (50MG Tablet, Oral) Active. Duexis (800-26.6MG Tablet, Oral) Active.  Past Surgical History Rotator Cuff Repair  right Vasectomy    Review of Systems  General Not Present- Chills, Fatigue, Fever, Memory Loss, Night Sweats, Weight Gain and Weight Loss. Skin Not Present- Eczema, Hives, Itching, Lesions and Rash. HEENT Not Present- Dentures, Double Vision, Headache, Hearing Loss, Tinnitus and Visual Loss. Respiratory Not Present- Allergies, Chronic Cough, Coughing up blood, Shortness of breath at rest and Shortness of breath with exertion. Cardiovascular Not Present- Chest Pain, Difficulty Breathing Lying Down, Murmur, Palpitations, Racing/skipping heartbeats and Swelling. Gastrointestinal Not Present- Abdominal Pain, Bloody Stool, Constipation, Diarrhea, Difficulty Swallowing, Heartburn, Jaundice, Loss of appetitie, Nausea and Vomiting. Male Genitourinary Not Present- Blood in Urine, Discharge, Flank Pain, Incontinence, Painful Urination,  Urgency, Urinary frequency, Urinary Retention, Urinating at Night and Weak urinary stream. Musculoskeletal Present- Joint   Pain. Not Present- Back Pain, Joint Swelling, Morning Stiffness, Muscle Pain, Muscle Weakness and Spasms. Neurological Not Present- Blackout spells, Difficulty with balance, Dizziness, Paralysis, Tremor and Weakness. Psychiatric Not Present- Insomnia.  Vitals Weight: 218 lb Height: 73.5in Body Surface Area: 2.24 m Body Mass Index: 28.37 kg/m  Pulse: 68 (Regular)  BP: 148/72 (Sitting, Right Arm, Standard)   Physical Exam General Mental Status -Alert, cooperative and good historian. General Appearance-pleasant, Not in acute distress. Orientation-Oriented X3. Build & Nutrition-Well nourished and Well developed.  Head and Neck Head-normocephalic, atraumatic . Neck Global Assessment - supple, no bruit auscultated on the right, no bruit auscultated on the left.  Eye Pupil - Bilateral-Regular and Round. Motion - Bilateral-EOMI.  Chest and Lung Exam Auscultation Breath sounds - clear at anterior chest wall and clear at posterior chest wall. Adventitious sounds - No Adventitious sounds.  Cardiovascular Auscultation Rhythm - Regular rate and rhythm. Heart Sounds - S1 WNL and S2 WNL. Murmurs & Other Heart Sounds - Auscultation of the heart reveals - No Murmurs.  Abdomen Palpation/Percussion Tenderness - Abdomen is non-tender to palpation. Rigidity (guarding) - Abdomen is soft. Auscultation Auscultation of the abdomen reveals - Bowel sounds normal.  Male Genitourinary Note: Not done, not pertinent to present illness   Musculoskeletal Note: On exam, he is alert and oriented, in no apparent distress. His left hip can be flexed about 110. No internal rotation, about 10 degrees of external rotation, 30 degrees of abduction. He does have pain on internal rotation of the hip. Right hip flexion about 120, rotation in 30, out 40, abduction  40.  We looked over his radiographs from couple a months ago. He has bone-on-bone arthritis in the left hip with impingement morphology and large osteophytes. Right hip is arthritic but not as bad.  Assessment & Plan  Osteoarthritis of left hip  Note:Surgical Plans: Left Total Hip Replacement - Anterior Approach  Disposition: Home  PCP: Dr. Bruce BurchetEvelena Peatte  IV TXA  Anesthesia Issues: None  Signed electronically by Beckey RutterAlezandrew L Perkins, III PA-C

## 2015-07-08 ENCOUNTER — Encounter (HOSPITAL_COMMUNITY): Payer: Self-pay | Admitting: Anesthesiology

## 2015-07-08 ENCOUNTER — Inpatient Hospital Stay (HOSPITAL_COMMUNITY): Payer: 59

## 2015-07-08 ENCOUNTER — Inpatient Hospital Stay (HOSPITAL_COMMUNITY)
Admission: RE | Admit: 2015-07-08 | Discharge: 2015-07-09 | DRG: 470 | Disposition: A | Discharge status: Home Health | Payer: 59 | Source: Ambulatory Visit | Attending: Orthopedic Surgery | Admitting: Orthopedic Surgery

## 2015-07-08 ENCOUNTER — Encounter (HOSPITAL_COMMUNITY)
Admission: RE | Disposition: A | Discharge status: Home Health | Payer: Self-pay | Source: Ambulatory Visit | Attending: Orthopedic Surgery

## 2015-07-08 ENCOUNTER — Inpatient Hospital Stay (HOSPITAL_COMMUNITY): Payer: 59 | Admitting: Anesthesiology

## 2015-07-08 DIAGNOSIS — F419 Anxiety disorder, unspecified: Secondary | ICD-10-CM | POA: Diagnosis present

## 2015-07-08 DIAGNOSIS — I10 Essential (primary) hypertension: Secondary | ICD-10-CM | POA: Diagnosis present

## 2015-07-08 DIAGNOSIS — Z8249 Family history of ischemic heart disease and other diseases of the circulatory system: Secondary | ICD-10-CM | POA: Diagnosis not present

## 2015-07-08 DIAGNOSIS — M169 Osteoarthritis of hip, unspecified: Secondary | ICD-10-CM | POA: Diagnosis present

## 2015-07-08 DIAGNOSIS — Z8261 Family history of arthritis: Secondary | ICD-10-CM

## 2015-07-08 DIAGNOSIS — Z79899 Other long term (current) drug therapy: Secondary | ICD-10-CM

## 2015-07-08 DIAGNOSIS — Z96649 Presence of unspecified artificial hip joint: Secondary | ICD-10-CM

## 2015-07-08 DIAGNOSIS — M1612 Unilateral primary osteoarthritis, left hip: Secondary | ICD-10-CM | POA: Diagnosis present

## 2015-07-08 DIAGNOSIS — M25552 Pain in left hip: Secondary | ICD-10-CM | POA: Diagnosis present

## 2015-07-08 DIAGNOSIS — Z87891 Personal history of nicotine dependence: Secondary | ICD-10-CM | POA: Diagnosis not present

## 2015-07-08 HISTORY — PX: TOTAL HIP ARTHROPLASTY: SHX124

## 2015-07-08 LAB — ABO/RH: ABO/RH(D): O POS

## 2015-07-08 LAB — TYPE AND SCREEN
ABO/RH(D): O POS
Antibody Screen: NEGATIVE

## 2015-07-08 SURGERY — ARTHROPLASTY, HIP, TOTAL, ANTERIOR APPROACH
Anesthesia: Spinal | Site: Hip | Laterality: Left

## 2015-07-08 MED ORDER — ACETAMINOPHEN 325 MG PO TABS: 650.0000 mg | ORAL_TABLET | Freq: Four times a day (QID) | ORAL | Status: DC | PRN

## 2015-07-08 MED ORDER — ACETAMINOPHEN 10 MG/ML IV SOLN
1000.0000 mg | Freq: Once | INTRAVENOUS | Status: AC
  Administered 2015-07-08: 1000 mg via INTRAVENOUS

## 2015-07-08 MED ORDER — PHENOL 1.4 % MT LIQD: 1.0000 | OROMUCOSAL | Status: DC | PRN

## 2015-07-08 MED ORDER — FENTANYL CITRATE (PF) 100 MCG/2ML IJ SOLN
INTRAMUSCULAR | Status: DC | PRN
  Administered 2015-07-08 (×2): 50 ug via INTRAVENOUS

## 2015-07-08 MED ORDER — HYDROMORPHONE HCL 1 MG/ML IJ SOLN: 0.2500 mg | INTRAMUSCULAR | Status: DC | PRN

## 2015-07-08 MED ORDER — MORPHINE SULFATE (PF) 2 MG/ML IV SOLN
1.0000 mg | INTRAVENOUS | Status: DC | PRN
  Administered 2015-07-08 (×2): 1 mg via INTRAVENOUS
  Filled 2015-07-08 (×2): qty 1

## 2015-07-08 MED ORDER — FENTANYL CITRATE (PF) 100 MCG/2ML IJ SOLN
INTRAMUSCULAR | Status: AC
  Filled 2015-07-08: qty 2

## 2015-07-08 MED ORDER — 0.9 % SODIUM CHLORIDE (POUR BTL) OPTIME
TOPICAL | Status: DC | PRN
  Administered 2015-07-08: 1000 mL

## 2015-07-08 MED ORDER — BUPIVACAINE HCL (PF) 0.5 % IJ SOLN
INTRAMUSCULAR | Status: DC | PRN
  Administered 2015-07-08: 3 mL via INTRATHECAL

## 2015-07-08 MED ORDER — DEXAMETHASONE SODIUM PHOSPHATE 10 MG/ML IJ SOLN
10.0000 mg | Freq: Once | INTRAMUSCULAR | Status: AC
  Administered 2015-07-08: 10 mg via INTRAVENOUS

## 2015-07-08 MED ORDER — MENTHOL 3 MG MT LOZG: 1.0000 | LOZENGE | OROMUCOSAL | Status: DC | PRN

## 2015-07-08 MED ORDER — DEXAMETHASONE SODIUM PHOSPHATE 10 MG/ML IJ SOLN
10.0000 mg | Freq: Once | INTRAMUSCULAR | Status: AC
  Administered 2015-07-09: 10 mg via INTRAVENOUS
  Filled 2015-07-08: qty 1

## 2015-07-08 MED ORDER — BUPIVACAINE HCL (PF) 0.5 % IJ SOLN
INTRAMUSCULAR | Status: AC
  Filled 2015-07-08: qty 30

## 2015-07-08 MED ORDER — METHOCARBAMOL 1000 MG/10ML IJ SOLN
500.0000 mg | Freq: Four times a day (QID) | INTRAVENOUS | Status: DC | PRN
  Filled 2015-07-08: qty 5

## 2015-07-08 MED ORDER — ONDANSETRON HCL 4 MG/2ML IJ SOLN
INTRAMUSCULAR | Status: AC
  Filled 2015-07-08: qty 2

## 2015-07-08 MED ORDER — CEFAZOLIN SODIUM-DEXTROSE 2-4 GM/100ML-% IV SOLN
INTRAVENOUS | Status: AC
  Filled 2015-07-08: qty 100

## 2015-07-08 MED ORDER — TRANEXAMIC ACID 1000 MG/10ML IV SOLN
1000.0000 mg | INTRAVENOUS | Status: AC
  Administered 2015-07-08: 1000 mg via INTRAVENOUS
  Filled 2015-07-08: qty 10

## 2015-07-08 MED ORDER — SERTRALINE HCL 50 MG PO TABS
50.0000 mg | ORAL_TABLET | Freq: Every day | ORAL | Status: DC
  Administered 2015-07-09: 50 mg via ORAL
  Filled 2015-07-08: qty 1

## 2015-07-08 MED ORDER — MIDAZOLAM HCL 2 MG/2ML IJ SOLN
INTRAMUSCULAR | Status: AC
  Filled 2015-07-08: qty 2

## 2015-07-08 MED ORDER — SODIUM CHLORIDE 0.9 % IV SOLN
INTRAVENOUS | Status: DC
  Administered 2015-07-08: 1000 mL via INTRAVENOUS
  Administered 2015-07-08: 23:00:00 via INTRAVENOUS

## 2015-07-08 MED ORDER — ACETAMINOPHEN 650 MG RE SUPP: 650.0000 mg | Freq: Four times a day (QID) | RECTAL | Status: DC | PRN

## 2015-07-08 MED ORDER — METHOCARBAMOL 500 MG PO TABS
500.0000 mg | ORAL_TABLET | Freq: Four times a day (QID) | ORAL | Status: DC | PRN
  Administered 2015-07-08 – 2015-07-09 (×4): 500 mg via ORAL
  Filled 2015-07-08 (×4): qty 1

## 2015-07-08 MED ORDER — BUPIVACAINE HCL (PF) 0.25 % IJ SOLN
INTRAMUSCULAR | Status: DC | PRN
  Administered 2015-07-08: 30 mL

## 2015-07-08 MED ORDER — PHENYLEPHRINE HCL 10 MG/ML IJ SOLN
INTRAMUSCULAR | Status: AC
  Filled 2015-07-08: qty 2

## 2015-07-08 MED ORDER — PROPOFOL 500 MG/50ML IV EMUL
INTRAVENOUS | Status: DC | PRN
  Administered 2015-07-08: 75 ug/kg/min via INTRAVENOUS

## 2015-07-08 MED ORDER — ONDANSETRON HCL 4 MG/2ML IJ SOLN: 4.0000 mg | Freq: Four times a day (QID) | INTRAMUSCULAR | Status: DC | PRN

## 2015-07-08 MED ORDER — RIVAROXABAN 10 MG PO TABS
10.0000 mg | ORAL_TABLET | Freq: Every day | ORAL | Status: DC
  Administered 2015-07-09: 10 mg via ORAL
  Filled 2015-07-08: qty 1

## 2015-07-08 MED ORDER — OXYCODONE HCL 5 MG PO TABS
5.0000 mg | ORAL_TABLET | ORAL | Status: DC | PRN
  Administered 2015-07-08: 10 mg via ORAL
  Administered 2015-07-08: 5 mg via ORAL
  Administered 2015-07-08 – 2015-07-09 (×5): 10 mg via ORAL
  Filled 2015-07-08 (×2): qty 2
  Filled 2015-07-08: qty 1
  Filled 2015-07-08 (×4): qty 2

## 2015-07-08 MED ORDER — MIDAZOLAM HCL 5 MG/5ML IJ SOLN
INTRAMUSCULAR | Status: DC | PRN
  Administered 2015-07-08: 2 mg via INTRAVENOUS

## 2015-07-08 MED ORDER — DOCUSATE SODIUM 100 MG PO CAPS
100.0000 mg | ORAL_CAPSULE | Freq: Two times a day (BID) | ORAL | Status: DC
  Administered 2015-07-08 – 2015-07-09 (×2): 100 mg via ORAL
  Filled 2015-07-08 (×2): qty 1

## 2015-07-08 MED ORDER — ACETAMINOPHEN 500 MG PO TABS
1000.0000 mg | ORAL_TABLET | Freq: Four times a day (QID) | ORAL | Status: AC
  Administered 2015-07-08 – 2015-07-09 (×4): 1000 mg via ORAL
  Filled 2015-07-08 (×4): qty 2

## 2015-07-08 MED ORDER — ONDANSETRON HCL 4 MG PO TABS: 4.0000 mg | ORAL_TABLET | Freq: Four times a day (QID) | ORAL | Status: DC | PRN

## 2015-07-08 MED ORDER — ACETAMINOPHEN 10 MG/ML IV SOLN
INTRAVENOUS | Status: AC
  Filled 2015-07-08: qty 100

## 2015-07-08 MED ORDER — DIPHENHYDRAMINE HCL 12.5 MG/5ML PO ELIX: 12.5000 mg | ORAL_SOLUTION | ORAL | Status: DC | PRN

## 2015-07-08 MED ORDER — METOCLOPRAMIDE HCL 5 MG/ML IJ SOLN: 5.0000 mg | Freq: Three times a day (TID) | INTRAMUSCULAR | Status: DC | PRN

## 2015-07-08 MED ORDER — CLONAZEPAM 1 MG PO TABS
1.0000 mg | ORAL_TABLET | Freq: Every evening | ORAL | Status: DC | PRN
  Administered 2015-07-08: 1 mg via ORAL
  Filled 2015-07-08: qty 1

## 2015-07-08 MED ORDER — FLEET ENEMA 7-19 GM/118ML RE ENEM: 1.0000 | ENEMA | Freq: Once | RECTAL | Status: DC | PRN

## 2015-07-08 MED ORDER — POLYETHYLENE GLYCOL 3350 17 G PO PACK: 17.0000 g | PACK | Freq: Every day | ORAL | Status: DC | PRN

## 2015-07-08 MED ORDER — STERILE WATER FOR IRRIGATION IR SOLN
Status: DC | PRN
  Administered 2015-07-08: 2000 mL

## 2015-07-08 MED ORDER — PHENYLEPHRINE HCL 10 MG/ML IJ SOLN
10.0000 mg | INTRAVENOUS | Status: DC | PRN
  Administered 2015-07-08: 40 ug/min via INTRAVENOUS

## 2015-07-08 MED ORDER — DEXAMETHASONE SODIUM PHOSPHATE 10 MG/ML IJ SOLN
INTRAMUSCULAR | Status: AC
  Filled 2015-07-08: qty 1

## 2015-07-08 MED ORDER — BUPIVACAINE HCL (PF) 0.25 % IJ SOLN
INTRAMUSCULAR | Status: AC
  Filled 2015-07-08: qty 30

## 2015-07-08 MED ORDER — METOCLOPRAMIDE HCL 5 MG PO TABS: 5.0000 mg | ORAL_TABLET | Freq: Three times a day (TID) | ORAL | Status: DC | PRN

## 2015-07-08 MED ORDER — PROPOFOL 10 MG/ML IV BOLUS
INTRAVENOUS | Status: DC | PRN
  Administered 2015-07-08: 40 mg via INTRAVENOUS
  Administered 2015-07-08 (×2): 20 mg via INTRAVENOUS

## 2015-07-08 MED ORDER — BISACODYL 10 MG RE SUPP: 10.0000 mg | Freq: Every day | RECTAL | Status: DC | PRN

## 2015-07-08 MED ORDER — CEFAZOLIN SODIUM-DEXTROSE 2-4 GM/100ML-% IV SOLN
2.0000 g | INTRAVENOUS | Status: AC
  Administered 2015-07-08: 2 g via INTRAVENOUS

## 2015-07-08 MED ORDER — PROPOFOL 10 MG/ML IV BOLUS
INTRAVENOUS | Status: AC
  Filled 2015-07-08: qty 60

## 2015-07-08 MED ORDER — PROPOFOL 10 MG/ML IV BOLUS
INTRAVENOUS | Status: AC
  Filled 2015-07-08: qty 20

## 2015-07-08 MED ORDER — CEFAZOLIN SODIUM-DEXTROSE 2-4 GM/100ML-% IV SOLN
2.0000 g | Freq: Four times a day (QID) | INTRAVENOUS | Status: AC
  Administered 2015-07-08 – 2015-07-09 (×2): 2 g via INTRAVENOUS
  Filled 2015-07-08 (×2): qty 100

## 2015-07-08 MED ORDER — PROMETHAZINE HCL 25 MG/ML IJ SOLN: 6.2500 mg | INTRAMUSCULAR | Status: DC | PRN

## 2015-07-08 MED ORDER — LACTATED RINGERS IV SOLN
INTRAVENOUS | Status: DC
  Administered 2015-07-08 (×2): via INTRAVENOUS

## 2015-07-08 MED ORDER — LOSARTAN POTASSIUM 50 MG PO TABS
50.0000 mg | ORAL_TABLET | Freq: Every day | ORAL | Status: DC
  Administered 2015-07-09: 50 mg via ORAL
  Filled 2015-07-08: qty 1

## 2015-07-08 MED ORDER — TRANEXAMIC ACID 1000 MG/10ML IV SOLN
1000.0000 mg | Freq: Once | INTRAVENOUS | Status: AC
  Administered 2015-07-08: 1000 mg via INTRAVENOUS
  Filled 2015-07-08: qty 10

## 2015-07-08 MED ORDER — ONDANSETRON HCL 4 MG/2ML IJ SOLN
INTRAMUSCULAR | Status: DC | PRN
  Administered 2015-07-08: 4 mg via INTRAVENOUS

## 2015-07-08 SURGICAL SUPPLY — 36 items
BAG DECANTER FOR FLEXI CONT (MISCELLANEOUS) ×3 IMPLANT
BAG ZIPLOCK 12X15 (MISCELLANEOUS) IMPLANT
BLADE SAG 18X100X1.27 (BLADE) ×3 IMPLANT
CAPT HIP TOTAL 2 ×3 IMPLANT
CLOSURE WOUND 1/2 X4 (GAUZE/BANDAGES/DRESSINGS) ×1
CLOTH BEACON ORANGE TIMEOUT ST (SAFETY) ×3 IMPLANT
COVER PERINEAL POST (MISCELLANEOUS) ×3 IMPLANT
DECANTER SPIKE VIAL GLASS SM (MISCELLANEOUS) ×3 IMPLANT
DRAPE STERI IOBAN 125X83 (DRAPES) ×3 IMPLANT
DRAPE U-SHAPE 47X51 STRL (DRAPES) ×6 IMPLANT
DRSG ADAPTIC 3X8 NADH LF (GAUZE/BANDAGES/DRESSINGS) ×3 IMPLANT
DRSG MEPILEX BORDER 4X4 (GAUZE/BANDAGES/DRESSINGS) ×3 IMPLANT
DRSG MEPILEX BORDER 4X8 (GAUZE/BANDAGES/DRESSINGS) ×3 IMPLANT
DURAPREP 26ML APPLICATOR (WOUND CARE) ×3 IMPLANT
ELECT REM PT RETURN 9FT ADLT (ELECTROSURGICAL) ×3
ELECTRODE REM PT RTRN 9FT ADLT (ELECTROSURGICAL) ×1 IMPLANT
EVACUATOR 1/8 PVC DRAIN (DRAIN) ×3 IMPLANT
GLOVE BIO SURGEON STRL SZ7.5 (GLOVE) ×3 IMPLANT
GLOVE BIO SURGEON STRL SZ8 (GLOVE) ×6 IMPLANT
GLOVE BIOGEL PI IND STRL 7.5 (GLOVE) ×4 IMPLANT
GLOVE BIOGEL PI IND STRL 8 (GLOVE) ×2 IMPLANT
GLOVE BIOGEL PI INDICATOR 7.5 (GLOVE) ×8
GLOVE BIOGEL PI INDICATOR 8 (GLOVE) ×4
GLOVE SURG SS PI 7.5 STRL IVOR (GLOVE) ×3 IMPLANT
GOWN STRL REUS W/TWL LRG LVL3 (GOWN DISPOSABLE) ×6 IMPLANT
GOWN STRL REUS W/TWL XL LVL3 (GOWN DISPOSABLE) ×6 IMPLANT
PACK ANTERIOR HIP CUSTOM (KITS) ×3 IMPLANT
STRIP CLOSURE SKIN 1/2X4 (GAUZE/BANDAGES/DRESSINGS) ×2 IMPLANT
SUT ETHIBOND NAB CT1 #1 30IN (SUTURE) ×3 IMPLANT
SUT MNCRL AB 4-0 PS2 18 (SUTURE) ×3 IMPLANT
SUT VIC AB 2-0 CT1 27 (SUTURE) ×4
SUT VIC AB 2-0 CT1 TAPERPNT 27 (SUTURE) ×2 IMPLANT
SUT VLOC 180 0 24IN GS25 (SUTURE) ×3 IMPLANT
SYR 50ML LL SCALE MARK (SYRINGE) IMPLANT
TRAY FOLEY W/METER SILVER 16FR (SET/KITS/TRAYS/PACK) ×3 IMPLANT
YANKAUER SUCT BULB TIP 10FT TU (MISCELLANEOUS) ×3 IMPLANT

## 2015-07-08 NOTE — Interval H&P Note (Signed)
History and Physical Interval Note:  07/08/2015 1:09 PM  Michael Bradford  has presented today for surgery, with the diagnosis of LEFT HIP OA  The various methods of treatment have been discussed with the patient and family. After consideration of risks, benefits and other options for treatment, the patient has consented to  Procedure(s): LEFT TOTAL HIP ARTHROPLASTY ANTERIOR APPROACH (Left) as a surgical intervention .  The patient's history has been reviewed, patient examined, no change in status, stable for surgery.  I have reviewed the patient's chart and labs.  Questions were answered to the patient's satisfaction.     Loanne DrillingALUISIO,Daylin Gruszka V

## 2015-07-08 NOTE — Anesthesia Preprocedure Evaluation (Signed)
Anesthesia Evaluation  Patient identified by MRN, date of birth, ID band Patient awake    Reviewed: Allergy & Precautions, NPO status , Patient's Chart, lab work & pertinent test results  Airway Mallampati: II  TM Distance: >3 FB Neck ROM: Full    Dental no notable dental hx.    Pulmonary former smoker,    Pulmonary exam normal breath sounds clear to auscultation       Cardiovascular hypertension, Pt. on medications Normal cardiovascular exam Rhythm:Regular Rate:Normal     Neuro/Psych PSYCHIATRIC DISORDERS Anxiety negative neurological ROS     GI/Hepatic negative GI ROS, Neg liver ROS,   Endo/Other  negative endocrine ROS  Renal/GU negative Renal ROS  negative genitourinary   Musculoskeletal  (+) Arthritis ,   Abdominal   Peds negative pediatric ROS (+)  Hematology negative hematology ROS (+)   Anesthesia Other Findings   Reproductive/Obstetrics negative OB ROS                             Anesthesia Physical Anesthesia Plan  ASA: II  Anesthesia Plan: Spinal   Post-op Pain Management:    Induction: Intravenous  Airway Management Planned: Natural Airway  Additional Equipment:   Intra-op Plan:   Post-operative Plan:   Informed Consent: I have reviewed the patients History and Physical, chart, labs and discussed the procedure including the risks, benefits and alternatives for the proposed anesthesia with the patient or authorized representative who has indicated his/her understanding and acceptance.   Dental advisory given  Plan Discussed with: CRNA  Anesthesia Plan Comments:         Anesthesia Quick Evaluation

## 2015-07-08 NOTE — Op Note (Signed)
OPERATIVE REPORT  PREOPERATIVE DIAGNOSIS: Osteoarthritis of the Left hip.   POSTOPERATIVE DIAGNOSIS: Osteoarthritis of the Left  hip.   PROCEDURE: Left total hip arthroplasty, anterior approach.   SURGEON: Ollen GrossFrank Navie Lamoreaux, MD   ASSISTANT: Leilani AbleSteve Chabon, PA-C  ANESTHESIA:  Spinal  ESTIMATED BLOOD LOSS:-450 ml    DRAINS: Hemovac x1.   COMPLICATIONS: None   CONDITION: PACU - hemodynamically stable.   BRIEF CLINICAL NOTE: Michael Bradford is a 55 y.o. male who has advanced end-  stage arthritis of his Right  hip with progressively worsening pain and  dysfunction.The patient has failed nonoperative management and presents for  total hip arthroplasty.   PROCEDURE IN DETAIL: After successful administration of spinal  anesthetic, the traction boots [facility] were placed on both  feet and the [facility], boots placed into the leg  holders. The Right hip was then isolated from the perineum with plastic  drapes and prepped and draped in the usual sterile fashion. ASIS and  greater trochanter were marked and a oblique incision was made, starting  at about 1 cm lateral and 2 cm distal to the ASIS and coursing towards  the anterior cortex of the femur. The skin was cut with a 10 blade  through subcutaneous tissue to the level of the fascia overlying the  tensor fascia lata muscle. The fascia was then incised in line with the  incision at the junction of the anterior third and posterior 2/3rd. The  muscle was teased off the fascia and then the interval between the TFL  and the rectus was developed. The Hohmann retractor was then placed at  the top of the femoral neck over the capsule. The vessels overlying the  capsule were cauterized and the fat on top of the capsule was removed.  A Hohmann retractor was then placed anterior underneath the rectus  femoris to give exposure to the entire anterior capsule. A T-shaped  capsulotomy was performed.  The edges were tagged and the femoral head  was identified.       Osteophytes are removed off the superior acetabulum.  The femoral neck was then cut in situ with an oscillating saw. Traction  was then applied to the left lower extremity utilizing the [facility]  traction. The femoral head was then removed. Retractors were placed  around the acetabulum and then circumferential removal of the labrum was  performed. Osteophytes were also removed. Reaming starts at 47 mm to  medialize and  Increased in 2 mm increments to 53 mm. We reamed in  approximately 40 degrees of abduction, 20 degrees anteversion. A 54 mm  pinnacle acetabular shell was then impacted in anatomic position under  fluoroscopic guidance with excellent purchase. We did not need to place  any additional dome screws. A 36 mm neutral + 4 marathon liner was then  placed into the acetabular shell.       The femoral lift was then placed along the lateral aspect of the femur  just distal to the vastus ridge. The leg was  externally rotated and capsule  was stripped off the inferior aspect of the femoral neck down to the  level of the lesser trochanter, this was done with electrocautery. The femur was lifted after this was performed. The  leg was then placed and extended in adducted position to essentially delivering the femur. We also removed the capsule superiorly and the  piriformis from the  piriformis fossa to gain excellent exposure of the  proximal femur. Rongeur was used to remove some cancellous bone to get  into the lateral portion of the proximal femur for placement of the  initial starter reamer. The starter broaches was placed  the starter broach  and was shown to go down the center of the canal. Broaching  with the  Corail system was then performed starting at size 8, coursing  Up to size 14. A size 14 had excellent torsional and rotational  and axial stability. The trial high offset neck was then placed  with a 36 + 5 trial  head. The hip was then reduced. We confirmed that  the stem was in the canal both on AP and lateral x-rays. It also has excellent sizing. The hip was reduced with outstanding stability through full extension, full external rotation,  and then flexion in adduction internal rotation. AP pelvis was taken  and the leg lengths were measured and found to be exactly equal. Hip  was then dislocated again and the femoral head and neck removed. The  femoral broach was removed. Size 14 Corail stem with a high offset  neck was then impacted into the femur following native anteversion. Has  excellent purchase in the canal. Excellent torsional and rotational and  axial stability. It is confirmed to be in the canal on AP and lateral  fluoroscopic views. The 36 + 5 ceramic head was placed and the hip  reduced with outstanding stability. Again AP pelvis was taken and it  confirmed that the leg lengths were equal. The wound was then copiously  irrigated with saline solution and the capsule reattached and repaired  with Ethibond suture. 30 ml of .25% Bupivicaine injected into the capsule and into the edge of the tensor fascia lata as well as subcutaneous tissue. The fascia overlying the tensor fascia lata was  then closed with a running #1 V-Loc. Subcu was closed with interrupted  2-0 Vicryl and subcuticular running 4-0 Monocryl. Incision was cleaned  and dried. Steri-Strips and a bulky sterile dressing applied. Hemovac  drain was hooked to suction and then he was awakened and transported to  recovery in stable condition.        Please note that a surgical assistant was a medical necessity for this procedure to perform it in a safe and expeditious manner. Assistant was necessary to provide appropriate retraction of vital neurovascular structures and to prevent femoral fracture and allow for anatomic placement of the prosthesis.  Ollen Gross, M.D.

## 2015-07-08 NOTE — Transfer of Care (Signed)
Immediate Anesthesia Transfer of Care Note  Patient: Tyrone AppleMichael A Warmack  Procedure(s) Performed: Procedure(s): LEFT TOTAL HIP ARTHROPLASTY ANTERIOR APPROACH (Left)  Patient Location: PACU  Anesthesia Type:Spinal  Level of Consciousness: awake and alert   Airway & Oxygen Therapy: Patient Spontanous Breathing and Patient connected to face mask oxygen  Post-op Assessment: Report given to RN and Post -op Vital signs reviewed and stable  Post vital signs: Reviewed and stable  Last Vitals:  Filed Vitals:   07/08/15 1140 07/08/15 1507  BP: 121/89   Pulse: 75 54  Temp: 36.6 C 36.8 C  Resp: 18 10    Last Pain:  Filed Vitals:   07/08/15 1507  PainSc: 2       Patients Stated Pain Goal: 4 (07/08/15 1200)  Complications: No apparent anesthesia complications

## 2015-07-08 NOTE — Anesthesia Procedure Notes (Signed)
Spinal Patient location during procedure: OR Start time: 07/08/2015 1:15 PM End time: 07/08/2015 1:20 PM Reason for block: at surgeon's request Staffing Resident/CRNA: Anne Fu Performed by: resident/CRNA  Preanesthetic Checklist Completed: patient identified, site marked, surgical consent, pre-op evaluation, timeout performed, IV checked, risks and benefits discussed, monitors and equipment checked and at surgeon's request Spinal Block Patient position: sitting Prep: Betadine Patient monitoring: heart rate, continuous pulse ox and blood pressure Approach: right paramedian Location: L2-3 Injection technique: single-shot Needle Needle type: Whitacre  Needle gauge: 25 G Needle length: 9 cm Assessment Sensory level: T6 Additional Notes Expiration date of kit checked and confirmed. Patient tolerated procedure well, without complications. X 1 attempt with noted clear CSF return. Loss of motor and sensory on exam post injection.

## 2015-07-08 NOTE — Anesthesia Postprocedure Evaluation (Signed)
Anesthesia Post Note  Patient: Michael Bradford  Procedure(s) Performed: Procedure(s) (LRB): LEFT TOTAL HIP ARTHROPLASTY ANTERIOR APPROACH (Left)  Patient location during evaluation: PACU Anesthesia Type: Spinal Level of consciousness: oriented and awake and alert Pain management: pain level controlled Vital Signs Assessment: post-procedure vital signs reviewed and stable Respiratory status: spontaneous breathing, respiratory function stable and patient connected to nasal cannula oxygen Cardiovascular status: blood pressure returned to baseline and stable Postop Assessment: no headache, no backache, spinal receding and patient able to bend at knees Anesthetic complications: no    Last Vitals:  Filed Vitals:   07/08/15 1619 07/08/15 1725  BP: 101/59 101/52  Pulse: 67 57  Temp: 36.4 C 36.4 C  Resp: 12 12    Last Pain:  Filed Vitals:   07/08/15 1741  PainSc: 0-No pain                 [name]

## 2015-07-08 NOTE — H&P (View-Only) (Signed)
Michael Bradford DOB: 1960/01/22 Married / Language: English / Race: White Male Date of Admission:  07/08/2015 CC:  Left Hip Pain History of Present Illness The patient is a 55 year old male who comes infor a preoperative History and Physical. The patient is scheduled for a left total hip arthroplasty (anterior) to be performed by Dr. Gus Rankin. Aluisio, MD at [facility] on 07-08-2015. The patient is being followed for their left osteoarthritis. They are months out from IA injection. Symptoms reported include: pain (pt. is having popping with his right knee.). The patient feels that they are doing poorly and report their pain level to be moderate to severe (weightbearing). Current treatment includes: NSAIDs (Meloxicam). The following medication has been used for pain control: antiinflammatory medication. The patient has not gotten any relief of their symptoms with Cortisone injections (IA injection did not help). Michael Bradford would like to proceed with surgery at this time and undergo hip replacement surgery. He states the hip has gotten much worse over time. He has pain with most activities now. It is limiting what he can and cannot do. He has tried injections and therapy but continuess to have pain. They have been treated conservatively in the past for the above stated problem and despite conservative measures, they continue to have progressive pain and severe functional limitations and dysfunction. They have failed non-operative management including home exercise, medications, and injections. It is felt that they would benefit from undergoing total joint replacement. Risks and benefits of the procedure have been discussed with the patient and they elect to proceed with surgery. There are no active contraindications to surgery such as ongoing infection or rapidly progressive neurological disease.  Problem List/Past Medical  Strain of flexor muscle of left hip, subsequent encounter (J47.829F)   Osteoarthritis of left hip, unspecified osteoarthritis type (M16.12)  Dislocation closed interphalangeal hand (834.02) [05/22/2006]: Anxiety Disorder  High blood pressure  Allergies No Known Drug Allergies   Family History Heart Disease  Father. Hypertension  Father. Rheumatoid Arthritis  Mother.  Social History Tobacco use  Former smoker. 03/06/2014: smoke(d) less than 1/2 pack(s) per day Children  2 Current drinker  03/06/2014: Currently drinks wine 5-7 times per week Current work status  working full time Exercise  Exercises daily; does running / walking, individual sport, gym / [employer] and team sport Living situation  live with spouse Marital status  married No history of drug/alcohol rehab  Not under pain contract  Number of flights of stairs before winded  greater than 5 Tobacco / smoke exposure  03/06/2014: no Plan - HOME  Medication History Losartan Potassium (  Tablet, Oral) Active. Sertraline HCl (  Tablet, Oral) Active. Duexis (800-26.6MG  Tablet, Oral) Active.  Past Surgical History Rotator Cuff Repair  right Vasectomy    Review of Systems  General Not Present- Chills, Fatigue, Fever, Memory Loss, Night Sweats, Weight Gain and Weight Loss. Skin Not Present- Eczema, Hives, Itching, Lesions and Rash. HEENT Not Present- Dentures, Double Vision, Headache, Hearing Loss, Tinnitus and Visual Loss. Respiratory Not Present- Allergies, Chronic Cough, Coughing up blood, Shortness of breath at rest and Shortness of breath with exertion. Cardiovascular Not Present- Chest Pain, Difficulty Breathing Lying Down, Murmur, Palpitations, Racing/skipping heartbeats and Swelling. Gastrointestinal Not Present- Abdominal Pain, Bloody Stool, Constipation, Diarrhea, Difficulty Swallowing, Heartburn, Jaundice, Loss of appetitie, Nausea and Vomiting. Male Genitourinary Not Present- Blood in Urine, Discharge, Flank Pain, Incontinence, Painful Urination,  Urgency, Urinary frequency, Urinary Retention, Urinating at Night and Weak urinary stream. Musculoskeletal Present- Joint  Pain. Not Present- Back Pain, Joint Swelling, Morning Stiffness, Muscle Pain, Muscle Weakness and Spasms. Neurological Not Present- Blackout spells, Difficulty with balance, Dizziness, Paralysis, Tremor and Weakness. Psychiatric Not Present- Insomnia.  Vitals Weight: 218 lb Height: 73.5in Body Surface Area: 2.24 m Body Mass Index: 28.37 kg/m  Pulse: 68 (Regular)  BP: 148/72 (Sitting, Right Arm, Standard)   Physical Exam General Mental Status -Alert, cooperative and good historian. General Appearance-pleasant, Not in acute distress. Orientation-Oriented X3. Build & Nutrition-Well nourished and Well developed.  Head and Neck Head-normocephalic, atraumatic . Neck Global Assessment - supple, no bruit auscultated on the right, no bruit auscultated on the left.  Eye Pupil - Bilateral-Regular and Round. Motion - Bilateral-EOMI.  Chest and Lung Exam Auscultation Breath sounds - clear at anterior chest wall and clear at posterior chest wall. Adventitious sounds - No Adventitious sounds.  Cardiovascular Auscultation Rhythm - Regular rate and rhythm. Heart Sounds - S1 WNL and S2 WNL. Murmurs & Other Heart Sounds - Auscultation of the heart reveals - No Murmurs.  Abdomen Palpation/Percussion Tenderness - Abdomen is non-tender to palpation. Rigidity (guarding) - Abdomen is soft. Auscultation Auscultation of the abdomen reveals - Bowel sounds normal.  Male Genitourinary Note: Not done, not pertinent to present illness   Musculoskeletal Note: On exam, he is alert and oriented, in no apparent distress. His left hip can be flexed about 110. No internal rotation, about 10 degrees of external rotation, 30 degrees of abduction. He does have pain on internal rotation of the hip. Right hip flexion about 120, rotation in 30, out 40, abduction  40.  We looked over his radiographs from couple a months ago. He has bone-on-bone arthritis in the left hip with impingement morphology and large osteophytes. Right hip is arthritic but not as bad.  Assessment & Plan  Osteoarthritis of left hip  Note:Surgical Plans: Left Total Hip Replacement - Anterior Approach  Disposition: Home  PCP: Dr. Bruce BurchetEvelena Peatte  IV TXA  Anesthesia Issues: None  Signed electronically by Beckey RutterAlezandrew L Maika Mcelveen, III PA-C

## 2015-07-09 ENCOUNTER — Encounter (HOSPITAL_COMMUNITY): Payer: Self-pay | Admitting: Orthopedic Surgery

## 2015-07-09 LAB — CBC
HEMATOCRIT: 39.3 % (ref 39.0–52.0)
Hemoglobin: 13.9 g/dL (ref 13.0–17.0)
MCH: 32.9 pg (ref 26.0–34.0)
MCHC: 35.4 g/dL (ref 30.0–36.0)
MCV: 93.1 fL (ref 78.0–100.0)
Platelets: 163 10*3/uL (ref 150–400)
RBC: 4.22 MIL/uL (ref 4.22–5.81)
RDW: 12.4 % (ref 11.5–15.5)
WBC: 13.6 10*3/uL — ABNORMAL HIGH (ref 4.0–10.5)

## 2015-07-09 LAB — BASIC METABOLIC PANEL
ANION GAP: 6 (ref 5–15)
BUN: 15 mg/dL (ref 6–20)
CALCIUM: 8.7 mg/dL — AB (ref 8.9–10.3)
CO2: 26 mmol/L (ref 22–32)
CREATININE: 0.94 mg/dL (ref 0.61–1.24)
Chloride: 104 mmol/L (ref 101–111)
GFR calc Af Amer: >60 mL/min (ref >60)
GFR calc non Af Amer: >60 mL/min (ref >60)
GLUCOSE: 146 mg/dL — AB (ref 65–99)
Potassium: 4.3 mmol/L (ref 3.5–5.1)
Sodium: 136 mmol/L (ref 135–145)

## 2015-07-09 MED ORDER — METHOCARBAMOL 500 MG PO TABS: 500.0000 mg | ORAL_TABLET | Freq: Four times a day (QID) | ORAL | Status: DC | PRN

## 2015-07-09 MED ORDER — CLONAZEPAM 1 MG PO TABS: 1.0000 mg | ORAL_TABLET | Freq: Every day | ORAL | Status: DC

## 2015-07-09 MED ORDER — ZOLPIDEM TARTRATE ER 12.5 MG PO TBCR: 12.5000 mg | EXTENDED_RELEASE_TABLET | Freq: Every evening | ORAL | Status: DC | PRN

## 2015-07-09 MED ORDER — OXYCODONE HCL 5 MG PO TABS: 5.0000 mg | ORAL_TABLET | ORAL | Status: DC | PRN

## 2015-07-09 MED ORDER — RIVAROXABAN 10 MG PO TABS: 10.0000 mg | ORAL_TABLET | Freq: Every day | ORAL | Status: DC

## 2015-07-09 NOTE — Progress Notes (Signed)
Physical Therapy Treatment Patient Details Name: Michael DenverMichael A Leipold MRN: [id number] DOB: 31-May-1960 Today's Date: 07/09/2015    History of Present Illness s/p L THA, AA    PT Comments    Pt progressing very well with mobility.  Reviewed car transfers and stairs with pt and spouse.  Follow Up Recommendations  Home health PT     Equipment Recommendations  None recommended by PT    Recommendations for Other Services OT consult     Precautions / Restrictions Precautions Precautions: None Restrictions Weight Bearing Restrictions: No Other Position/Activity Restrictions: WBAT    Mobility  Bed Mobility Overal bed mobility: Modified Independent             General bed mobility comments: min cues for use of R LE to self assist  Transfers Overall transfer level: Needs assistance Equipment used: Rolling walker (2 wheeled) Transfers: Sit to/from Stand Sit to Stand: Supervision         General transfer comment: min cues for LE management and use of UEs to self assist  Ambulation/Gait Ambulation/Gait assistance: Supervision Ambulation Distance (Feet): 70 Feet Assistive device: Rolling walker (2 wheeled) Gait Pattern/deviations: Step-through pattern;Decreased step length - right;Decreased step length - left;Shuffle;Trunk flexed Gait velocity: decr Gait velocity interpretation: Below normal speed for age/gender General Gait Details: min cues for posture and position from RW   Stairs Stairs: Yes Stairs assistance: Min guard Stair Management: One rail Left;Forwards;With cane;Step to pattern Number of Stairs: 10 General stair comments: cues for sequence and foot/cane placement.  6 stairs with cane and rail and 4 steps with rail only - min change in pt ability  Wheelchair Mobility    Modified Rankin (Stroke Patients Only)       Balance                                    Cognition Arousal/Alertness: Awake/alert Behavior During Therapy: WFL for  tasks assessed/performed Overall Cognitive Status: Within Functional Limits for tasks assessed                      Exercises Total Joint Exercises Ankle Circles/Pumps: AROM;Both;20 reps;Supine Quad Sets: AROM;Both;10 reps;Supine Heel Slides: AAROM;Left;20 reps;Supine Hip ABduction/ADduction: AAROM;Left;15 reps;Supine    General Comments        Pertinent Vitals/Pain Pain Assessment: 0-10 Pain Score: 2  Faces Pain Scale: Hurts a little bit Pain Location: L hip Pain Descriptors / Indicators: Aching;Sore Pain Intervention(s): Premedicated before session;Monitored during session;Limited activity within patient's tolerance;Ice applied    Home Living Family/patient expects to be discharged to:: Private residence Living Arrangements: Spouse/significant other Available Help at Discharge: Family Type of Home: House Home Access: Stairs to enter Entrance Stairs-Rails: None Home Layout: Two level;Able to live on main level with bedroom/bathroom Home Equipment: Walker - 2 wheels      Prior Function Level of Independence: Independent          PT Goals (current goals can now be found in the care plan section) Acute Rehab PT Goals Patient Stated Goal: home today PT Goal Formulation: With patient Time For Goal Achievement: 07/10/15 Potential to Achieve Goals: Good Progress towards PT goals: Progressing toward goals    Frequency  7X/week    PT Plan Current plan remains appropriate    Co-evaluation             End of Session Equipment Utilized During Treatment: Gait belt Activity Tolerance:  Patient tolerated treatment well Patient left: in chair;with call bell/phone within reach     Time: 1103-1126 PT Time Calculation (min) (ACUTE ONLY): 23 min  Charges:  $Gait Training: 8-22 mins $Therapeutic Exercise: 8-22 mins                    G Codes:      Bobi Daudelin 07/09/2015, 1:19 PM

## 2015-07-09 NOTE — Evaluation (Signed)
Physical Therapy Evaluation Patient Details Name: Michael DenverMichael A Andreoli MRN: [id number] DOB: Feb 13, 1960 Today's Date: 07/09/2015   History of Present Illness  s/p L THA, AA  Clinical Impression  Pt s/p L THR presents with decreased L LE strength/ROM and post op pain limiting functional mobility.  Pt plans dc home with family assist.    Follow Up Recommendations Home health PT    Equipment Recommendations  None recommended by PT    Recommendations for Other Services OT consult     Precautions / Restrictions Precautions Precautions: None Restrictions Weight Bearing Restrictions: No Other Position/Activity Restrictions: WBAT      Mobility  Bed Mobility Overal bed mobility: Modified Independent             General bed mobility comments: min cues for use of R LE to self assist  Transfers Overall transfer level: Needs assistance Equipment used: Rolling walker (2 wheeled) Transfers: Sit to/from Stand Sit to Stand: Min guard         General transfer comment: cues for LE management and use of UEs to self assist  Ambulation/Gait Ambulation/Gait assistance: Min guard Ambulation Distance (Feet): 250 Feet Assistive device: Rolling walker (2 wheeled) Gait Pattern/deviations: Step-to pattern;Step-through pattern;Decreased step length - right;Decreased step length - left;Shuffle;Trunk flexed Gait velocity: decr Gait velocity interpretation: Below normal speed for age/gender General Gait Details: cues for posture, position from RW and initial sequence  Stairs            Wheelchair Mobility    Modified Rankin (Stroke Patients Only)       Balance                                             Pertinent Vitals/Pain Pain Assessment: 0-10 Pain Score: 2  Faces Pain Scale: Hurts a little bit Pain Location: L hip Pain Descriptors / Indicators: Aching;Sore Pain Intervention(s): Limited activity within patient's tolerance;Monitored during  session;Premedicated before session;Ice applied    Home Living Family/patient expects to be discharged to:: Private residence Living Arrangements: Spouse/significant other Available Help at Discharge: Family Type of Home: House Home Access: Stairs to enter Entrance Stairs-Rails: None Entrance Stairs-Number of Steps: 2 Home Layout: Two level;Able to live on main level with bedroom/bathroom Home Equipment: Walker - 2 wheels      Prior Function Level of Independence: Independent               Hand Dominance        Extremity/Trunk Assessment   Upper Extremity Assessment: Overall WFL for tasks assessed           Lower Extremity Assessment: LLE deficits/detail   LLE Deficits / Details: Strength at hip 3-/5 with AAROM at hip to 90 flex and 30 abd  Cervical / Trunk Assessment: Normal  Communication   Communication: No difficulties  Cognition Arousal/Alertness: Awake/alert Behavior During Therapy: WFL for tasks assessed/performed Overall Cognitive Status: Within Functional Limits for tasks assessed                      General Comments      Exercises Total Joint Exercises Ankle Circles/Pumps: AROM;Both;20 reps;Supine Quad Sets: AROM;Both;10 reps;Supine Heel Slides: AAROM;Left;20 reps;Supine Hip ABduction/ADduction: AAROM;Left;15 reps;Supine      Assessment/Plan    PT Assessment Patient needs continued PT services  PT Diagnosis Difficulty walking   PT Problem List Decreased strength;Decreased range  of motion;Decreased activity tolerance;Decreased mobility;Decreased knowledge of use of DME;Pain  PT Treatment Interventions DME instruction;Gait training;Stair training;Functional mobility training;Therapeutic activities;Therapeutic exercise;Patient/family education   PT Goals (Current goals can be found in the Care Plan section) Acute Rehab PT Goals Patient Stated Goal: home today PT Goal Formulation: With patient Time For Goal Achievement:  07/10/15 Potential to Achieve Goals: Good    Frequency 7X/week   Barriers to discharge        Co-evaluation               End of Session Equipment Utilized During Treatment: Gait belt Activity Tolerance: Patient tolerated treatment well Patient left: in chair;with call bell/phone within reach Nurse Communication: Mobility status         Time: 0820-0850 PT Time Calculation (min) (ACUTE ONLY): 30 min   Charges:   PT Evaluation $PT Eval Low Complexity: 1 Procedure PT Treatments $Therapeutic Exercise: 8-22 mins   PT G Codes:        Jessia Kief 07/09/2015, 10:54 AM

## 2015-07-09 NOTE — Progress Notes (Signed)
   Subjective: 1 Day Post-Op Procedure(s) (LRB): LEFT TOTAL HIP ARTHROPLASTY ANTERIOR APPROACH (Left) Patient reports pain as mild.   Patient seen in rounds with Dr. Lequita HaltAluisio. Patient is well, and has had no acute complaints or problems other than difficulty sleeping. No SOB or chest pain.  Plan is to go Home after hospital stay.  Objective: Vital signs in last 24 hours: Temp:  [97.5 F (36.4 C)-98.3 F (36.8 C)] 97.8 F (36.6 C) (06/22 40980633) Pulse Rate:  [54-75] 62 (06/22 0633) Resp:  [10-18] 16 (06/22 11910633) BP: (93-121)/(51-89) 112/68 mmHg (06/22 0633) SpO2:  [97 %-99 %] 98 % (06/22 0633) Weight:  [101.152 kg (223 lb)] 101.152 kg (223 lb) (06/21 1619)  Intake/Output from previous day:  Intake/Output Summary (Last 24 hours) at 07/09/15 0733 Last data filed at 07/09/15 0500  Gross per 24 hour  Intake 4466.33 ml  Output   3970 ml  Net 496.33 ml    Intake/Output this shift:    Labs:  Recent Labs  07/09/15 0417  HGB 13.9    Recent Labs  07/09/15 0417  WBC 13.6*  RBC 4.22  HCT 39.3  PLT 163    Recent Labs  07/09/15 0417  NA 136  K 4.3  CL 104  CO2 26  BUN 15  CREATININE 0.94  GLUCOSE 146*  CALCIUM 8.7*    EXAM General - Patient is Alert and Oriented Extremity - Neurologically intact Intact pulses distally Dorsiflexion/Plantar flexion intact Dressing - dressing C/D/I Motor Function - intact, moving foot and toes well on exam.  Hemovac pulled without difficulty.  Past Medical History  Diagnosis Date  . ALLERGIC RHINITIS   . Hypertension     eval at age 55  . Recurrent inguinal hernia     asymptomatic  . Herpes simplex   . Anxiety     situational    Assessment/Plan: 1 Day Post-Op Procedure(s) (LRB): LEFT TOTAL HIP ARTHROPLASTY ANTERIOR APPROACH (Left) Principal Problem:   OA (osteoarthritis) of hip  Estimated body mass index is 30.24 kg/(m^2) as calculated from the following:   Height as of this encounter: 6' (1.829 m).   Weight as  of this encounter: 101.152 kg (223 lb). Advance diet Up with therapy D/C IV fluids Discharge home with home health  DVT Prophylaxis - Xarelto Weight Bearing As Tolerated left Leg  Therapy today. Plan for DC home if he progresses well.    Dimitri PedAmber Kamira Mellette, PA-C Orthopaedic Surgery 07/09/2015, 7:33 AM

## 2015-07-09 NOTE — Progress Notes (Signed)
Discharge planning, spoke with patient and girlfriend at bedside. [facility]. Contacted [facility] for referral. Has RW, declines 3-n-1. [phone]

## 2015-07-09 NOTE — Progress Notes (Signed)
Patient alert oriented with spouse at bedside. Pain controlled. Patient received discharge instructions and prescriptions. All questions and concerns answered. Home health setup. Patient verbalized understanding of all discharge instructions.

## 2015-07-09 NOTE — Progress Notes (Signed)
Occupational Therapy Evaluation Patient Details Name: Michael Bradford MRN: [id number] DOB: [birth year] Today's Date: 07/09/2015    History of Present Illness s/p L THA, AA   Clinical Impression   All OT education completed and pt questions answered. No further OT needs at this time; will sign off.    Follow Up Recommendations  No OT follow up;Supervision - Intermittent    Equipment Recommendations  None recommended by OT (pt declines 3 in 1)    Recommendations for Other Services       Precautions / Restrictions Precautions Precautions: None Restrictions Weight Bearing Restrictions: No Other Position/Activity Restrictions: WBAT      Mobility Bed Mobility               General bed mobility comments: NT -- up in recliner  Transfers                 General transfer comment: NT -- pt declined    Balance                                            ADL Overall ADL's : Needs assistance/impaired                                       General ADL Comments: Patient received up in recliner. Reviewed OT techniques with patient regarding LB dressing, shower transfer technique. Patient denies needing to practice prior to discharge home. He reports having pain with ADLs prior to surgery, and improvement of pain since his surgery. Patient has standard toilet at home but does not feel he needs a 3 in 1. States he has a RW to borrow. Answered pt questions re: car transfer, lifting, showering, stairs (though I did defer further education of stairs to PT). Patient verbalized understanding.     Vision     Perception     Praxis      Pertinent Vitals/Pain Pain Assessment: Faces Faces Pain Scale: Hurts a little bit Pain Location: L hip Pain Descriptors / Indicators: Sore Pain Intervention(s): Monitored during session     Hand Dominance     Extremity/Trunk Assessment Upper Extremity Assessment Upper Extremity Assessment:  Overall WFL for tasks assessed   Lower Extremity Assessment Lower Extremity Assessment: Defer to PT evaluation       Communication Communication Communication: No difficulties   Cognition Arousal/Alertness: Awake/alert Behavior During Therapy: WFL for tasks assessed/performed Overall Cognitive Status: Within Functional Limits for tasks assessed                     General Comments       Exercises       Shoulder Instructions      Home Living Family/patient expects to be discharged to:: Private residence Living Arrangements: Spouse/significant other Available Help at Discharge: Family Type of Home: House             Bathroom Shower/Tub: Walk-in [employer] resources officershower   Bathroom Toilet: Standard Bathroom Accessibility: Yes How Accessible: Accessible via walker Home Equipment: Walker - 2 wheels          Prior Functioning/Environment Level of Independence: Independent             OT Diagnosis: Acute pain   OT Problem List: Decreased strength;Decreased range of motion;Decreased knowledge of  use of DME or AE;Pain   OT Treatment/Interventions:      OT Goals(Current goals can be found in the care plan section) Acute Rehab OT Goals Patient Stated Goal: home today OT Goal Formulation: All assessment and education complete, DC therapy  OT Frequency:     Barriers to D/C:            Co-evaluation              End of Session    Activity Tolerance: Patient tolerated treatment well Patient left: in chair;with call bell/phone within reach;with family/visitor present   Time: [id number] OT Time Calculation (min): 12 min Charges:  OT General Charges $OT Visit: 1 Procedure OT Evaluation $OT Eval Low Complexity: 1 Procedure G-Codes:    Michael Bradford A 07/09/2015, 10:26 AM

## 2015-07-09 NOTE — Discharge Instructions (Addendum)
° °Dr. Frank Aluisio °Total Joint Specialist °[facility] °[address] °[place], [place] [address] °[phone] ° °ANTERIOR APPROACH TOTAL HIP REPLACEMENT POSTOPERATIVE DIRECTIONS ° ° °Hip Rehabilitation, Guidelines Following Surgery  °The results of a hip operation are greatly improved after range of motion and muscle strengthening exercises. Follow all safety measures which are given to protect your hip. If any of these exercises cause increased pain or swelling in your joint, decrease the amount until you are comfortable again. Then slowly increase the exercises. Call your caregiver if you have problems or questions.  ° °HOME CARE INSTRUCTIONS  °Remove items at home which could result in a fall. This includes throw rugs or furniture in walking pathways.  °· ICE to the affected hip every three hours for 30 minutes at a time and then as needed for pain and swelling.  Continue to use ice on the hip for pain and swelling from surgery. You may notice swelling that will progress down to the foot and ankle.  This is normal after surgery.  Elevate the leg when you are not up walking on it.   °· Continue to use the breathing machine which will help keep your temperature down.  It is common for your temperature to cycle up and down following surgery, especially at night when you are not up moving around and exerting yourself.  The breathing machine keeps your lungs expanded and your temperature down. ° ° °DIET °You may resume your previous home diet once your are discharged from the hospital. ° °DRESSING / WOUND CARE / SHOWERING °You may start showering once you are discharged home but do not submerge the incision under water. Just pat the incision dry and apply a dry gauze dressing on daily. °Change the surgical dressing daily and reapply a dry dressing each time. ° °ACTIVITY °Walk with your walker as instructed. °Use walker as long as suggested by your caregivers. °Avoid periods of inactivity  such as sitting longer than an hour when not asleep. This helps prevent blood clots.  °You may resume a sexual relationship in one month or when given the OK by your doctor.  °You may return to work once you are cleared by your doctor.  °Do not drive a car for 6 weeks or until released by you surgeon.  °Do not drive while taking narcotics. ° °WEIGHT BEARING °Weight bearing as tolerated with assist device (walker, cane, etc) as directed, use it as long as suggested by your surgeon or therapist, typically at least 4-6 weeks. ° °POSTOPERATIVE CONSTIPATION PROTOCOL °Constipation - defined medically as fewer than three stools per week and severe constipation as less than one stool per week. ° °One of the most common issues patients have following surgery is constipation.  Even if you have a regular bowel pattern at home, your normal regimen is likely to be disrupted due to multiple reasons following surgery.  Combination of anesthesia, postoperative narcotics, change in appetite and fluid intake all can affect your bowels.  In order to avoid complications following surgery, here are some recommendations in order to help you during your recovery period. ° °Colace (docusate) - Pick up an over-the-counter form of Colace or another stool softener and take twice a day as long as you are requiring postoperative pain medications.  Take with a full glass of water daily.  If you experience loose stools or diarrhea, hold the colace until you stool forms back up.  If your symptoms do not get better within 1   week or if they get worse, check with your doctor. ° °Dulcolax (bisacodyl) - Pick up over-the-counter and take as directed by the product packaging as needed to assist with the movement of your bowels.  Take with a full glass of water.  Use this product as needed if not relieved by Colace only.  ° °MiraLax (polyethylene glycol) - Pick up over-the-counter to have on hand.  MiraLax is a solution that will increase the amount of  water in your bowels to assist with bowel movements.  Take as directed and can mix with a glass of water, juice, soda, coffee, or tea.  Take if you go more than two days without a movement. °Do not use MiraLax more than once per day. Call your doctor if you are still constipated or irregular after using this medication for 7 days in a row. ° °If you continue to have problems with postoperative constipation, please contact the office for further assistance and recommendations.  If you experience "the worst abdominal pain ever" or develop nausea or vomiting, please contact the office immediatly for further recommendations for treatment. ° °ITCHING ° If you experience itching with your medications, try taking only a single pain pill, or even half a pain pill at a time.  You can also use Benadryl over the counter for itching or also to help with sleep.  ° °TED HOSE STOCKINGS °Wear the elastic stockings on both legs for three weeks following surgery during the day but you may remove then at night for sleeping. ° °MEDICATIONS °See your medication summary on the “After Visit Summary” that the nursing staff will review with you prior to discharge.  You may have some home medications which will be placed on hold until you complete the course of blood thinner medication.  It is important for you to complete the blood thinner medication as prescribed by your surgeon.  Continue your approved medications as instructed at time of discharge. ° °PRECAUTIONS °If you experience chest pain or shortness of breath - call 911 immediately for transfer to the hospital emergency department.  °If you develop a fever greater that 101 F, purulent drainage from wound, increased redness or drainage from wound, foul odor from the wound/dressing, or calf pain - CONTACT YOUR SURGEON.   °                                                °FOLLOW-UP APPOINTMENTS °Make sure you keep all of your appointments after your operation with your surgeon and  caregivers. You should call the office at the above phone number and make an appointment for approximately two weeks after the date of your surgery or on the date instructed by your surgeon outlined in the "After Visit Summary". ° °RANGE OF MOTION AND STRENGTHENING EXERCISES  °These exercises are designed to help you keep full movement of your hip joint. Follow your caregiver's or physical therapist's instructions. Perform all exercises about fifteen times, three times per day or as directed. Exercise both hips, even if you have had only one joint replacement. These exercises can be done on a training (exercise) mat, on the floor, on a table or on a bed. Use whatever works the best and is most comfortable for you. Use music or television while you are exercising so that the exercises are a pleasant break in your day. This   will make your life better with the exercises acting as a break in routine you can look forward to.  °Lying on your back, slowly slide your foot toward your buttocks, raising your knee up off the floor. Then slowly slide your foot back down until your leg is straight again.  °Lying on your back spread your legs as far apart as you can without causing discomfort.  °Lying on your side, raise your upper leg and foot straight up from the floor as far as is comfortable. Slowly lower the leg and repeat.  °Lying on your back, tighten up the muscle in the front of your thigh (quadriceps muscles). You can do this by keeping your leg straight and trying to raise your heel off the floor. This helps strengthen the largest muscle supporting your knee.  °Lying on your back, tighten up the muscles of your buttocks both with the legs straight and with the knee bent at a comfortable angle while keeping your heel on the floor.  ° °IF YOU ARE TRANSFERRED TO A SKILLED REHAB FACILITY °If the patient is transferred to a skilled rehab facility following release from the hospital, a list of the current medications will be  sent to the facility for the patient to continue.  When discharged from the skilled rehab facility, please have the facility set up the patient's Home Health Physical Therapy prior to being released. Also, the skilled facility will be responsible for providing the patient with their medications at time of release from the facility to include their pain medication, the muscle relaxants, and their blood thinner medication. If the patient is still at the rehab facility at time of the two week follow up appointment, the skilled rehab facility will also need to assist the patient in arranging follow up appointment in our office and any transportation needs. ° °MAKE SURE YOU:  °Understand these instructions.  °Get help right away if you are not doing well or get worse.  ° ° °Pick up stool softner and laxative for home use following surgery while on pain medications. °Do not submerge incision under water. °Please use good hand washing techniques while changing dressing each day. °May shower starting three days after surgery. °Please use a clean towel to pat the incision dry following showers. °Continue to use ice for pain and swelling after surgery. °Do not use any lotions or creams on the incision until instructed by your surgeon. ° ° ° °Information on my medicine - XARELTO® (Rivaroxaban) ° °This medication education was reviewed with me or my healthcare representative as part of my discharge preparation.  The pharmacist that spoke with me during my hospital stay was:  Bradie Sangiovanni, RPH ° °Why was Xarelto® prescribed for you? °Xarelto® was prescribed for you to reduce the risk of blood clots forming after orthopedic surgery. The medical term for these abnormal blood clots is venous thromboembolism (VTE). ° °What do you need to know about xarelto® ? °Take your Xarelto® ONCE DAILY at the same time every day. °You may take it either with or without food. ° °If you have difficulty swallowing the tablet whole, you may crush  it and mix in applesauce just prior to taking your dose. ° °Take Xarelto® exactly as prescribed by your doctor and DO NOT stop taking Xarelto® without talking to the doctor who prescribed the medication.  Stopping without other VTE prevention medication to take the place of Xarelto® may increase your risk of developing a clot. ° °After discharge, you should have regular   check-up appointments with your healthcare provider that is prescribing your Xarelto®.   ° °What do you do if you miss a dose? °If you miss a dose, take it as soon as you remember on the same day then continue your regularly scheduled once daily regimen the next day. Do not take two doses of Xarelto® on the same day.  ° °Important Safety Information °A possible side effect of Xarelto® is bleeding. You should call your healthcare provider right away if you experience any of the following: °? Bleeding from an injury or your nose that does not stop. °? Unusual colored urine (red or dark brown) or unusual colored stools (red or black). °? Unusual bruising for unknown reasons. °? A serious fall or if you hit your head (even if there is no bleeding). ° °Some medicines may interact with Xarelto® and might increase your risk of bleeding while on Xarelto®. To help avoid this, consult your healthcare provider or pharmacist prior to using any new prescription or non-prescription medications, including herbals, vitamins, non-steroidal anti-inflammatory drugs (NSAIDs) and supplements. ° °This website has more information on Xarelto®: [email or web address]. ° ° ° °

## 2015-07-10 NOTE — Discharge Summary (Signed)
Physician Discharge Summary   Patient ID: Michael Bradford MRN: [id number] DOB/AGE: 02-06-1960 55 y.o.  Admit date: 07/08/2015 Discharge date: 07/09/2015  Primary Diagnosis: Primary osteoarthritis left hip  Admission Diagnoses:  Past Medical History  Diagnosis Date  . ALLERGIC RHINITIS   . Hypertension     eval at age 55  . Recurrent inguinal hernia     asymptomatic  . Herpes simplex   . Anxiety     situational   Discharge Diagnoses:   Principal Problem:   OA (osteoarthritis) of hip  Estimated body mass index is 30.24 kg/(m^2) as calculated from the following:   Height as of this encounter: 6' (1.829 m).   Weight as of this encounter: 101.152 kg (223 lb).  Procedure(s) (LRB): LEFT TOTAL HIP ARTHROPLASTY ANTERIOR APPROACH (Left)   Consults: None  HPI: The patient is being followed for their left osteoarthritis. They are months out from IA injection. Symptoms reported include: pain (pt. is having popping with his right knee.). The patient feels that they are doing poorly and report their pain level to be moderate to severe (weightbearing). Current treatment includes: NSAIDs (Meloxicam). The following medication has been used for pain control: antiinflammatory medication. The patient has not gotten any relief of their symptoms with Cortisone injections (IA injection did not help). Michael Bradford would like to proceed with surgery at this time and undergo hip replacement surgery. He states the hip has gotten much worse over time. He has pain with most activities now. It is limiting what he can and cannot do. He has tried injections and therapy but continuess to have pain. They have been treated conservatively in the past for the above stated problem and despite conservative measures, they continue to have progressive pain and severe functional limitations and dysfunction. They have failed non-operative management including home exercise, medications, and injections. It is felt that they  would benefit from undergoing total joint replacement. Risks and benefits of the procedure have been discussed with the patient and they elect to proceed with surgery. There are no active contraindications to surgery such as ongoing infection or rapidly progressive neurological disease.  Laboratory Data: Admission on 07/08/2015, Discharged on 07/09/2015  Component Date Value Ref Range Status  . ABO/RH(D) 07/08/2015 O POS   Final  . Antibody Screen 07/08/2015 NEG   Final  . Sample Expiration 07/08/2015 07/11/2015   Final  . ABO/RH(D) 07/08/2015 O POS   Final  . WBC 07/09/2015 13.6* 4.0 - 10.5 K/uL Final  . RBC 07/09/2015 4.22  4.22 - 5.81 MIL/uL Final  . Hemoglobin 07/09/2015 13.9  13.0 - 17.0 g/dL Final  . HCT 07/09/2015 39.3  39.0 - 52.0 % Final  . MCV 07/09/2015 93.1  78.0 - 100.0 fL Final  . MCH 07/09/2015 32.9  26.0 - 34.0 pg Final  . MCHC 07/09/2015 35.4  30.0 - 36.0 g/dL Final  . RDW 07/09/2015 12.4  11.5 - 15.5 % Final  . Platelets 07/09/2015 163  150 - 400 K/uL Final  . Sodium 07/09/2015 136  135 - 145 mmol/L Final  . Potassium 07/09/2015 4.3  3.5 - 5.1 mmol/L Final  . Chloride 07/09/2015 104  101 - 111 mmol/L Final  . CO2 07/09/2015 26  22 - 32 mmol/L Final  . Glucose, Bld 07/09/2015 146* 65 - 99 mg/dL Final  . BUN 07/09/2015 15  6 - 20 mg/dL Final  . Creatinine, Ser 07/09/2015 0.94  0.61 - 1.24 mg/dL Final  . Calcium 07/09/2015 8.7* 8.9 - 10.3  mg/dL Final  . GFR calc non Af Amer 07/09/2015 >60  >60 mL/min Final  . GFR calc Af Amer 07/09/2015 >60  >60 mL/min Final   Comment: (NOTE) The eGFR has been calculated using the CKD EPI equation. This calculation has not been validated in all clinical situations. eGFR's persistently <60 mL/min signify possible Chronic Kidney Disease.   Georgiann Hahn gap 07/09/2015 6  5 - 15 Final  Hospital Outpatient Visit on 06/24/2015  Component Date Value Ref Range Status  . aPTT 06/24/2015 33  24 - 37 seconds Final  . WBC 06/24/2015 4.5  4.0 -  10.5 K/uL Final  . RBC 06/24/2015 4.87  4.22 - 5.81 MIL/uL Final  . Hemoglobin 06/24/2015 15.7  13.0 - 17.0 g/dL Final  . HCT 06/24/2015 45.2  39.0 - 52.0 % Final  . MCV 06/24/2015 92.8  78.0 - 100.0 fL Final  . MCH 06/24/2015 32.2  26.0 - 34.0 pg Final  . MCHC 06/24/2015 34.7  30.0 - 36.0 g/dL Final  . RDW 06/24/2015 12.5  11.5 - 15.5 % Final  . Platelets 06/24/2015 162  150 - 400 K/uL Final  . Sodium 06/24/2015 139  135 - 145 mmol/L Final  . Potassium 06/24/2015 3.7  3.5 - 5.1 mmol/L Final  . Chloride 06/24/2015 106  101 - 111 mmol/L Final  . CO2 06/24/2015 27  22 - 32 mmol/L Final  . Glucose, Bld 06/24/2015 98  65 - 99 mg/dL Final  . BUN 06/24/2015 15  6 - 20 mg/dL Final  . Creatinine, Ser 06/24/2015 1.07  0.61 - 1.24 mg/dL Final  . Calcium 06/24/2015 9.2  8.9 - 10.3 mg/dL Final  . Total Protein 06/24/2015 7.2  6.5 - 8.1 g/dL Final  . Albumin 06/24/2015 4.5  3.5 - 5.0 g/dL Final  . AST 06/24/2015 36  15 - 41 U/L Final  . ALT 06/24/2015 29  17 - 63 U/L Final  . Alkaline Phosphatase 06/24/2015 69  38 - 126 U/L Final  . Total Bilirubin 06/24/2015 1.1  0.3 - 1.2 mg/dL Final  . GFR calc non Af Amer 06/24/2015 >60  >60 mL/min Final  . GFR calc Af Amer 06/24/2015 >60  >60 mL/min Final   Comment: (NOTE) The eGFR has been calculated using the CKD EPI equation. This calculation has not been validated in all clinical situations. eGFR's persistently <60 mL/min signify possible Chronic Kidney Disease.   . Anion gap 06/24/2015 6  5 - 15 Final  . Prothrombin Time 06/24/2015 15.1  11.6 - 15.2 seconds Final  . INR 06/24/2015 1.17  0.00 - 1.49 Final  . Color, Urine 06/24/2015 YELLOW  YELLOW Final  . APPearance 06/24/2015 CLEAR  CLEAR Final  . Specific Gravity, Urine 06/24/2015 1.010  1.005 - 1.030 Final  . pH 06/24/2015 7.0  5.0 - 8.0 Final  . Glucose, UA 06/24/2015 NEGATIVE  NEGATIVE mg/dL Final  . Hgb urine dipstick 06/24/2015 TRACE* NEGATIVE Final  . Bilirubin Urine 06/24/2015 NEGATIVE   NEGATIVE Final  . Ketones, ur 06/24/2015 NEGATIVE  NEGATIVE mg/dL Final  . Protein, ur 06/24/2015 NEGATIVE  NEGATIVE mg/dL Final  . Nitrite 06/24/2015 NEGATIVE  NEGATIVE Final  . Leukocytes, UA 06/24/2015 NEGATIVE  NEGATIVE Final  . MRSA, PCR 06/24/2015 NEGATIVE  NEGATIVE Final  . Staphylococcus aureus 06/24/2015 NEGATIVE  NEGATIVE Final   Comment:        The Xpert SA Assay (FDA approved for NASAL specimens in patients over 51 years of age), is one component of  a comprehensive surveillance program.  Test performance has been validated by [facility] for patients greater than or equal to 82 year old. It is not intended to diagnose infection nor to guide or monitor treatment.   . Squamous Epithelial / LPF 06/24/2015 NONE SEEN  NONE SEEN Final  . WBC, UA 06/24/2015 0-5  0 - 5 WBC/hpf Final  . RBC / HPF 06/24/2015 0-5  0 - 5 RBC/hpf Final  . Bacteria, UA 06/24/2015 RARE* NONE SEEN Final  Office Visit on 05/20/2015  Component Date Value Ref Range Status  . Sodium 05/20/2015 139  135 - 145 mEq/L Final  . Potassium 05/20/2015 4.0  3.5 - 5.1 mEq/L Final  . Chloride 05/20/2015 105  96 - 112 mEq/L Final  . CO2 05/20/2015 25  19 - 32 mEq/L Final  . Glucose, Bld 05/20/2015 100* 70 - 99 mg/dL Final  . BUN 05/20/2015 17  6 - 23 mg/dL Final  . Creatinine, Ser 05/20/2015 0.92  0.40 - 1.50 mg/dL Final  . Calcium 05/20/2015 9.6  8.4 - 10.5 mg/dL Final  . GFR 05/20/2015 90.68  >60.00 mL/min Final  . Cholesterol 05/20/2015 225* 0 - 200 mg/dL Final   ATP III Classification       Desirable:  < 200 mg/dL               Borderline High:  200 - 239 mg/dL          High:  > = 240 mg/dL  . Triglycerides 05/20/2015 122.0  0.0 - 149.0 mg/dL Final   Normal:  <150 mg/dLBorderline High:  150 - 199 mg/dL  . HDL 05/20/2015 47.80  >39.00 mg/dL Final  . VLDL 05/20/2015 24.4  0.0 - 40.0 mg/dL Final  . LDL Cholesterol 05/20/2015 153* 0 - 99 mg/dL Final  . Total CHOL/HDL Ratio 05/20/2015 5   Final                   Men          Women1/2 Average Risk     3.4          3.3Average Risk          5.0          4.42X Average Risk          9.6          7.13X Average Risk          15.0          11.0                      . NonHDL 05/20/2015 177.65   Final   NOTE:  Non-HDL goal should be 30 mg/dL higher than patient's LDL goal (i.e. LDL goal of < 70 mg/dL, would have non-HDL goal of < 100 mg/dL)  . WBC 05/20/2015 5.9  4.0 - 10.5 K/uL Final  . RBC 05/20/2015 4.76  4.22 - 5.81 Mil/uL Final  . Hemoglobin 05/20/2015 15.4  13.0 - 17.0 g/dL Final  . HCT 05/20/2015 44.8  39.0 - 52.0 % Final  . MCV 05/20/2015 94.1  78.0 - 100.0 fl Final  . MCHC 05/20/2015 34.4  30.0 - 36.0 g/dL Final  . RDW 05/20/2015 13.0  11.5 - 15.5 % Final  . Platelets 05/20/2015 170.0  150.0 - 400.0 K/uL Final  . Neutrophils Relative % 05/20/2015 63.2  43.0 - 77.0 % Final  . Lymphocytes Relative 05/20/2015 26.0  12.0 - 46.0 %  Final  . Monocytes Relative 05/20/2015 8.1  3.0 - 12.0 % Final  . Eosinophils Relative 05/20/2015 2.3  0.0 - 5.0 % Final  . Basophils Relative 05/20/2015 0.4  0.0 - 3.0 % Final  . Neutro Abs 05/20/2015 3.7  1.4 - 7.7 K/uL Final  . Lymphs Abs 05/20/2015 1.5  0.7 - 4.0 K/uL Final  . Monocytes Absolute 05/20/2015 0.5  0.1 - 1.0 K/uL Final  . Eosinophils Absolute 05/20/2015 0.1  0.0 - 0.7 K/uL Final  . Basophils Absolute 05/20/2015 0.0  0.0 - 0.1 K/uL Final  . TSH 05/20/2015 2.30  0.35 - 4.50 uIU/mL Final  . Total Bilirubin 05/20/2015 0.8  0.2 - 1.2 mg/dL Final  . Bilirubin, Direct 05/20/2015 0.1  0.0 - 0.3 mg/dL Final  . Alkaline Phosphatase 05/20/2015 79  39 - 117 U/L Final  . AST 05/20/2015 25  0 - 37 U/L Final  . ALT 05/20/2015 20  0 - 53 U/L Final  . Total Protein 05/20/2015 7.2  6.0 - 8.3 g/dL Final  . Albumin 05/20/2015 4.6  3.5 - 5.2 g/dL Final  . PSA 05/20/2015 1.46  0.10 - 4.00 ng/mL Final     X-Rays:Dg Pelvis Portable  07/08/2015  CLINICAL DATA:  Postop for left hip arthroplasty. EXAM: PORTABLE PELVIS 1-2 VIEWS  COMPARISON:  CT of 02/07/2006 FINDINGS: Status post left hip arthroplasty. No acute hardware complication or periprosthetic fracture identified. Minimal right hip osteoarthritis. degenerate sclerosis involves the symphysis pubis. IMPRESSION: Expected appearance after left hip arthroplasty. Electronically Signed   By: Abigail Miyamoto M.D.   On: 07/08/2015 15:30   Dg C-arm 1-60 Min-no Report  07/08/2015  CLINICAL DATA: surgery C-ARM 1-60 MINUTES Fluoroscopy was utilized by the requesting physician.  No radiographic interpretation.    EKG: Orders placed or performed during the hospital encounter of 06/24/15  . EKG 12 lead  . EKG 12 lead     Hospital Course: Patient was admitted to [facility] and taken to the OR and underwent the above state procedure without complications.  Patient tolerated the procedure well and was later transferred to the recovery room and then to the orthopaedic floor for postoperative care.  They were given PO and IV analgesics for pain control following their surgery.  They were given 24 hours of postoperative antibiotics of  Anti-infectives    Start     Dose/Rate Route Frequency Ordered Stop   07/08/15 2000  ceFAZolin (ANCEF) IVPB 2g/100 mL premix     2 g 200 mL/hr over 30 Minutes Intravenous Every 6 hours 07/08/15 1629 07/09/15 0236   07/08/15 1146  ceFAZolin (ANCEF) IVPB 2g/100 mL premix     2 g 200 mL/hr over 30 Minutes Intravenous On call to O.R. 07/08/15 1146 07/08/15 1330     and started on DVT prophylaxis in the form of Xarelto.   PT and OT were ordered for total hip protocol.  The patient was allowed to be WBAT with therapy. Discharge planning was consulted to help with postop disposition and equipment needs.  Patient had a fair night on the evening of surgery but her did have some difficulty sleeping.  They started to get up OOB with therapy on day one.  Hemovac drain was pulled without difficulty.  Dressing was changed and the incision was clean anddry.   The patient had progressed with therapy and meeting their goals.  Incision was healing well.  Patient was seen in rounds and was ready to go home.   Diet:  Cardiac diet Activity:WBAT Follow-up:in 2 weeks Disposition - Home Discharged Condition: stable   Discharge Instructions    Call MD / Call 911    Complete by:  As directed   If you experience chest pain or shortness of breath, CALL 911 and be transported to the hospital emergency room.  If you develope a fever above 101 F, pus (white drainage) or increased drainage or redness at the wound, or calf pain, call your surgeon's office.     Constipation Prevention    Complete by:  As directed   Drink plenty of fluids.  Prune juice may be helpful.  You may use a stool softener, such as Colace (over the counter) 100 mg twice a day.  Use MiraLax (over the counter) for constipation as needed.     Diet - low sodium heart healthy    Complete by:  As directed      Discharge instructions    Complete by:  As directed   Dr. Gaynelle Arabian [facility] Joint Specialist [facility] [address], [place], [place] [address] [phone]  ANTERIOR APPROACH TOTAL HIP REPLACEMENT POSTOPERATIVE DIRECTIONS   Hip Rehabilitation, Guidelines Following Surgery  The results of a hip operation are greatly improved after range of motion and muscle strengthening exercises. Follow all safety measures which are given to protect your hip. If any of these exercises cause increased pain or swelling in your joint, decrease the amount until you are comfortable again. Then slowly increase the exercises. Call your caregiver if you have problems or questions.   HOME CARE INSTRUCTIONS  Remove items at home which could result in a fall. This includes throw rugs or furniture in walking pathways.  ICE to the affected hip every three hours for 30 minutes at a time and then as needed for pain and swelling.  Continue to use ice on the hip for pain and swelling  from surgery. You may notice swelling that will progress down to the foot and ankle.  This is normal after surgery.  Elevate the leg when you are not up walking on it.   Continue to use the breathing machine which will help keep your temperature down.  It is common for your temperature to cycle up and down following surgery, especially at night when you are not up moving around and exerting yourself.  The breathing machine keeps your lungs expanded and your temperature down.   DIET You may resume your previous home diet once your are discharged from the hospital.  DRESSING / WOUND CARE / SHOWERING You may start showering once you are discharged home but do not submerge the incision under water. Just pat the incision dry and apply a dry gauze dressing on daily. Change the surgical dressing daily and reapply a dry dressing each time.  ACTIVITY Walk with your walker as instructed. Use walker as long as suggested by your caregivers. Avoid periods of inactivity such as sitting longer than an hour when not asleep. This helps prevent blood clots.  You may resume a sexual relationship in one month or when given the OK by your doctor.  You may return to work once you are cleared by your doctor.  Do not drive a car for 6 weeks or until released by you surgeon.  Do not drive while taking narcotics.  WEIGHT BEARING Weight bearing as tolerated with assist device (walker, cane, etc) as directed, use it as long as suggested by your surgeon or therapist, typically at least 4-6 weeks.  POSTOPERATIVE CONSTIPATION PROTOCOL Constipation - defined medically as fewer than three stools per week and severe constipation as less than one stool per week.  One of the most common issues patients have following surgery is constipation.  Even if you have a regular bowel pattern at home, your normal regimen is likely to be disrupted due to multiple reasons following surgery.  Combination of anesthesia, postoperative  narcotics, change in appetite and fluid intake all can affect your bowels.  In order to avoid complications following surgery, here are some recommendations in order to help you during your recovery period.  Colace (docusate) - Pick up an over-the-counter form of Colace or another stool softener and take twice a day as long as you are requiring postoperative pain medications.  Take with a full glass of water daily.  If you experience loose stools or diarrhea, hold the colace until you stool forms back up.  If your symptoms do not get better within 1 week or if they get worse, check with your doctor.  Dulcolax (bisacodyl) - Pick up over-the-counter and take as directed by the product packaging as needed to assist with the movement of your bowels.  Take with a full glass of water.  Use this product as needed if not relieved by Colace only.   MiraLax (polyethylene glycol) - Pick up over-the-counter to have on hand.  MiraLax is a solution that will increase the amount of water in your bowels to assist with bowel movements.  Take as directed and can mix with a glass of water, juice, soda, coffee, or tea.  Take if you go more than two days without a movement. Do not use MiraLax more than once per day. Call your doctor if you are still constipated or irregular after using this medication for 7 days in a row.  If you continue to have problems with postoperative constipation, please contact the office for further assistance and recommendations.  If you experience "the worst abdominal pain ever" or develop nausea or vomiting, please contact the office immediatly for further recommendations for treatment.  ITCHING  If you experience itching with your medications, try taking only a single pain pill, or even half a pain pill at a time.  You can also use Benadryl over the counter for itching or also to help with sleep.   TED HOSE STOCKINGS Wear the elastic stockings on both legs for three weeks following surgery  during the day but you may remove then at night for sleeping.  MEDICATIONS See your medication summary on the "After Visit Summary" that the nursing staff will review with you prior to discharge.  You may have some home medications which will be placed on hold until you complete the course of blood thinner medication.  It is important for you to complete the blood thinner medication as prescribed by your surgeon.  Continue your approved medications as instructed at time of discharge.  PRECAUTIONS If you experience chest pain or shortness of breath - call 911 immediately for transfer to the hospital emergency department.  If you develop a fever greater that 101 F, purulent drainage from wound, increased redness or drainage from wound, foul odor from the wound/dressing, or calf pain - CONTACT YOUR SURGEON.  FOLLOW-UP APPOINTMENTS Make sure you keep all of your appointments after your operation with your surgeon and caregivers. You should call the office at the above phone number and make an appointment for approximately two weeks after the date of your surgery or on the date instructed by your surgeon outlined in the "After Visit Summary".  RANGE OF MOTION AND STRENGTHENING EXERCISES  These exercises are designed to help you keep full movement of your hip joint. Follow your caregiver's or physical therapist's instructions. Perform all exercises about fifteen times, three times per day or as directed. Exercise both hips, even if you have had only one joint replacement. These exercises can be done on a training (exercise) mat, on the floor, on a table or on a bed. Use whatever works the best and is most comfortable for you. Use music or television while you are exercising so that the exercises are a pleasant break in your day. This will make your life better with the exercises acting as a break in routine you can look forward to.  Lying on your back, slowly  slide your foot toward your buttocks, raising your knee up off the floor. Then slowly slide your foot back down until your leg is straight again.  Lying on your back spread your legs as far apart as you can without causing discomfort.  Lying on your side, raise your upper leg and foot straight up from the floor as far as is comfortable. Slowly lower the leg and repeat.  Lying on your back, tighten up the muscle in the front of your thigh (quadriceps muscles). You can do this by keeping your leg straight and trying to raise your heel off the floor. This helps strengthen the largest muscle supporting your knee.  Lying on your back, tighten up the muscles of your buttocks both with the legs straight and with the knee bent at a comfortable angle while keeping your heel on the floor.   IF YOU ARE TRANSFERRED TO A SKILLED REHAB FACILITY If the patient is transferred to a skilled rehab facility following release from the hospital, a list of the current medications will be sent to the facility for the patient to continue.  When discharged from the skilled rehab facility, please have the facility set up the patient's [place] prior to being released. Also, the skilled facility will be responsible for providing the patient with their medications at time of release from the facility to include their pain medication, the muscle relaxants, and their blood thinner medication. If the patient is still at the rehab facility at time of the two week follow up appointment, the skilled rehab facility will also need to assist the patient in arranging follow up appointment in our office and any transportation needs.  MAKE SURE YOU:  Understand these instructions.  Get help right away if you are not doing well or get worse.    Pick up stool softner and laxative for home use following surgery while on pain medications. Do not submerge incision under water. Please use good hand washing techniques while  changing dressing each day. May shower starting three days after surgery. Please use a clean towel to pat the incision dry following showers. Continue to use ice for pain and swelling after surgery. Do not use any lotions or creams on the incision until instructed by your surgeon.     Increase activity slowly as tolerated    Complete by:  As directed  Medication List    STOP taking these medications        b complex vitamins tablet     DUEXIS 800-26.6 MG Tabs  Generic drug:  Ibuprofen-Famotidine     multivitamin tablet     predniSONE 10 MG tablet  Commonly known as:  DELTASONE     VITAMIN C PO      TAKE these medications        acyclovir 200 MG capsule  Commonly known as:  ZOVIRAX  TAKE 1 CAPSULE (200 MG TOTAL) BY MOUTH AS NEEDED.     clonazePAM 1 MG tablet  Commonly known as:  KLONOPIN  Take 1 tablet (1 mg total) by mouth at bedtime as needed for anxiety.     clonazePAM 1 MG tablet  Commonly known as:  KLONOPIN  Take 1 tablet (1 mg total) by mouth at bedtime.     doxylamine (Sleep) 25 MG tablet  Commonly known as:  UNISOM  Take 25 mg by mouth at bedtime as needed for sleep.     losartan 50 MG tablet  Commonly known as:  COZAAR  Take 1 tablet (50 mg total) by mouth daily.     methocarbamol 500 MG tablet  Commonly known as:  ROBAXIN  Take 1 tablet (500 mg total) by mouth every 6 (six) hours as needed for muscle spasms.     oxyCODONE 5 MG immediate release tablet  Commonly known as:  Oxy IR/ROXICODONE  Take 1-2 tablets (5-10 mg total) by mouth every 3 (three) hours as needed for breakthrough pain.     rivaroxaban 10 MG Tabs tablet  Commonly known as:  XARELTO  Take 1 tablet (10 mg total) by mouth daily with breakfast.     sertraline 50 MG tablet  Commonly known as:  ZOLOFT  Take 1 tablet (50 mg total) by mouth daily.     tetrahydrozoline 0.05 % ophthalmic solution  Place 1 drop into both eyes 4 (four) times daily as needed (For eye  irritation.).     zolpidem 12.5 MG CR tablet  Commonly known as:  AMBIEN CR  Take 1 tablet (12.5 mg total) by mouth at bedtime as needed for sleep.           Follow-up Information    Follow up with Gearlean Alf, MD. Schedule an appointment as soon as possible for a visit on 07/24/2015.   Specialty:  Orthopedic Surgery   Contact information:   [address] [place] [address] [phone]       Follow up with [facility].   Why:  physical therapy   Contact information:   [address] [place]  [address] [phone]       Signed: Ardeen Jourdain, PA-C Orthopaedic Surgery 07/10/2015, 8:28 AM

## 2015-09-08 ENCOUNTER — Other Ambulatory Visit: Payer: Self-pay | Admitting: Family Medicine

## 2015-10-30 ENCOUNTER — Other Ambulatory Visit: Payer: Self-pay | Admitting: Family Medicine

## 2015-11-02 NOTE — Telephone Encounter (Signed)
Refill once.  Avoid regular use. 

## 2015-11-02 NOTE — Telephone Encounter (Signed)
Last OV 05/20/2015 Last refill 07/16/2015 #15 Please advise

## 2015-11-12 ENCOUNTER — Other Ambulatory Visit: Payer: Self-pay

## 2015-11-12 MED ORDER — ACYCLOVIR 200 MG PO CAPS: ORAL_CAPSULE | ORAL | 1 refills | Status: DC

## 2015-11-12 MED ORDER — LOSARTAN POTASSIUM 50 MG PO TABS: 50.0000 mg | ORAL_TABLET | Freq: Every day | ORAL | 2 refills | Status: DC

## 2015-11-12 MED ORDER — SERTRALINE HCL 50 MG PO TABS: 50.0000 mg | ORAL_TABLET | Freq: Every day | ORAL | 2 refills | Status: DC

## 2015-11-13 ENCOUNTER — Other Ambulatory Visit: Payer: Self-pay

## 2015-11-13 ENCOUNTER — Telehealth: Payer: Self-pay | Admitting: Family Medicine

## 2015-11-13 MED ORDER — CLONAZEPAM 1 MG PO TABS: 1.0000 mg | ORAL_TABLET | Freq: Every day | ORAL | 0 refills | Status: DC

## 2015-11-13 MED ORDER — ACYCLOVIR 200 MG PO CAPS: ORAL_CAPSULE | ORAL | 1 refills | Status: DC

## 2015-11-13 NOTE — Telephone Encounter (Signed)
Pt would like for Dr. Caryl NeverBurchette to refer him out to [facility]  [phone] (Dr. Annalee GentaShoemaker).  I advised pt that Dr. Caryl NeverBurchette is not in the office today and may need to be seen before being referred out pt state understanding.

## 2015-11-17 ENCOUNTER — Other Ambulatory Visit: Payer: Self-pay

## 2015-11-17 MED ORDER — ACYCLOVIR 200 MG PO CAPS: ORAL_CAPSULE | ORAL | 1 refills | Status: DC

## 2015-11-17 NOTE — Telephone Encounter (Signed)
Pt needs an appt before referral to even see if its necessary. Thanks.

## 2015-11-17 NOTE — Telephone Encounter (Signed)
Pt scheduled  

## 2015-11-18 ENCOUNTER — Ambulatory Visit (INDEPENDENT_AMBULATORY_CARE_PROVIDER_SITE_OTHER): Payer: 59 | Admitting: Family Medicine

## 2015-11-18 ENCOUNTER — Encounter: Payer: Self-pay | Admitting: Family Medicine

## 2015-11-18 VITALS — BP 140/90 | HR 93 | Temp 98.0°F | Ht 72.5 in | Wt 225.0 lb

## 2015-11-18 DIAGNOSIS — R319 Hematuria, unspecified: Secondary | ICD-10-CM | POA: Diagnosis not present

## 2015-11-18 DIAGNOSIS — R499 Unspecified voice and resonance disorder: Secondary | ICD-10-CM

## 2015-11-18 DIAGNOSIS — L821 Other seborrheic keratosis: Secondary | ICD-10-CM

## 2015-11-18 LAB — POCT URINALYSIS DIPSTICK
BILIRUBIN UA: NEGATIVE
GLUCOSE UA: NEGATIVE
Ketones, UA: NEGATIVE
Leukocytes, UA: NEGATIVE
Nitrite, UA: NEGATIVE
PH UA: 6
SPEC GRAV UA: 1.02
Urobilinogen, UA: 0.2

## 2015-11-18 MED ORDER — AMOXICILLIN 500 MG PO CAPS: ORAL_CAPSULE | ORAL | 0 refills | Status: DC

## 2015-11-18 NOTE — Progress Notes (Signed)
Subjective:     Patient ID: Michael Bradford, male   DOB: 07-Sep-1960, 55 y.o.   MRN: [id number]  HPI Patient seen today for the following multiple items.  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 Recently had several performances and around September 29 noticed change in his voice. This is noted at higher frequencies. He relates past history of vocal cord nodules with reported surgery when he was up in [place] in the past about 15 years ago. He tried voice rest over the past month but has not seen improvements. He is requesting ENT referral. He denies any recent GERD symptoms. No postnasal drip symptoms. No appetite or weight changes. Quit smoking 2015.  Patient noticed brownish discolored skin lesion left temporal area and also scaly patch tip of nose. No past history of skin cancer.  Patient relates he had some transient hematuria couple months ago following hip surgery. Not clear if he had Foley catheter at that time. Requesting repeat urine today. Has not had any recurrent gross hematuria. No burning with urination.  Past Medical History:  Diagnosis Date  . ALLERGIC RHINITIS   . Anxiety    situational  . Herpes simplex   . Hypertension    eval at age 55  . Recurrent inguinal hernia    asymptomatic   Past Surgical History:  Procedure Laterality Date  . FINGER SURGERY  1970, 1971   times three  . Right eye trauma  1975  . SHOULDER SURGERY  12/21/10   right, ROTATOR CUFF REPAIR  . TOTAL HIP ARTHROPLASTY Left 07/08/2015   Procedure: LEFT TOTAL HIP ARTHROPLASTY ANTERIOR APPROACH;  Surgeon: Ollen GrossFrank Aluisio, MD;  Location: [facility];  Service: Orthopedics;  Laterality: Left;  Marland Kitchen. VASECTOMY  1998  . vocal polyps  1992, 1996   times two  . WISDOM TOOTH EXTRACTION  1976    reports that he quit smoking about 22 months ago. His smoking use included Cigarettes. He quit after 10.00 years of use. He has never used smokeless tobacco. He reports that he drinks about 6.0 oz of  alcohol per week . He reports that he does not use drugs. family history includes Arthritis in his mother; Colon polyps (age of onset: 7250) in his father; Diabetes in his mother; Heart disease (age of onset: 4570) in his father; Hyperlipidemia in his brother; Hypertension in his brother and father; Obesity in his father; Thyroid disease in his sister. No Known Allergies   Review of Systems  Constitutional: Negative for appetite change, chills, fever and unexpected weight change.  HENT: Positive for voice change. Negative for postnasal drip and sore throat.   Gastrointestinal: Negative for abdominal pain.  Genitourinary: Negative for decreased urine volume and dysuria.  Hematological: Negative for adenopathy.       Objective:   Physical Exam  Constitutional: He appears well-developed and well-nourished.  HENT:  Mouth/Throat: Oropharynx is clear and moist.  Neck: Neck supple.  Cardiovascular: Normal rate and regular rhythm.   Pulmonary/Chest: Effort normal and breath sounds normal. No respiratory distress. He has no wheezes. He has no rales.  Lymphadenopathy:    He has no cervical adenopathy.  Skin:  Patient has approximately 1 cm diameter well-demarcated brownish scaly patch left temporal region. Left tip of nose minimal approximately 2 mm flat brownish scaly patch. No nodular qualities. No telangiectasias. No ulceration.       Assessment:     #1 recent vocal strain following singing. Reported history of vocal  cord nodules. Patient requesting ENT referral  #2 benign appearing seborrheic keratosis left face  #3 reported episode of gross hematuria several weeks ago with no ongoing hematuria or dysuria symptoms      Plan:     -Set up ENT referral Reassurance regarding seborrheic keratosis left face -Repeat urinalysis dipstick today shows trace blood. Set up urine microscopy. Urology referral if greater than 3 red blood cells per high-power field.  Kristian CoveyBruce W Khyre Germond MD [place]  [facility]

## 2015-11-18 NOTE — Progress Notes (Signed)
Pre visit review using our clinic review tool, if applicable. No additional management support is needed unless otherwise documented below in the visit note. 

## 2015-11-18 NOTE — Patient Instructions (Signed)
Suspect benign seborrheic keratosis of left face. We will set up ENT referral.

## 2016-01-14 ENCOUNTER — Telehealth: Payer: Self-pay | Admitting: Family Medicine

## 2016-01-14 DIAGNOSIS — Z87448 Personal history of other diseases of urinary system: Secondary | ICD-10-CM

## 2016-01-14 NOTE — Telephone Encounter (Signed)
Pt is calling back and would like the results of ua test that was done in nov 2017

## 2016-01-14 NOTE — Telephone Encounter (Signed)
Please Advise  Pt wanting results on mirco, but looking back through his chart Mirco was never completed.

## 2016-01-19 NOTE — Telephone Encounter (Signed)
We will have to repeat urine dipstick with order for reflex urine microscopy.

## 2016-01-20 ENCOUNTER — Telehealth: Payer: Self-pay | Admitting: Family Medicine

## 2016-01-20 NOTE — Addendum Note (Signed)
Addended by: Vito BackersOX, SHEENA H on: 01/20/2016 09:44 AM   Modules accepted: Orders

## 2016-01-20 NOTE — Telephone Encounter (Signed)
Main thing is to make sure he had naso-laryngoscopy per ENT to rule out any vocal cord lesions.  I am assuming they did this

## 2016-01-20 NOTE — Telephone Encounter (Signed)
Spoke to pt, told him urine is not back yet. Pt said I was wanting results from last urine. Told pt the results. Pt verbalized understanding and wanted to know if the urine he was coming to give tomorrow was being checked for cancer? Told pt no but he is checking to see if there is blood in it. If the urine comes back with blood then he will order extra testing. Pt verbalized understanding. Pt also said he would like to let Dr. Caryl NeverBurchette know that he is still c/o hoarseness and would like to know what he recommends. Told pt I will send message to him and someone will get back to him.

## 2016-01-20 NOTE — Telephone Encounter (Signed)
Pt would like ua result °

## 2016-01-20 NOTE — Telephone Encounter (Signed)
Dr. Caryl NeverBurchette, pt called about urine test tomorrow and I explained that to him. He also wanted to let you know that he is still c/o hoarseness and wanted to know what you recommended. He said he saw ENT and they said it was hoarseness too. Please advise.

## 2016-01-20 NOTE — Telephone Encounter (Signed)
Pt called regarding micro results. This was never ordered. Pt advised that he needs to return to office for another urine sample and we will send for micro. Orders placed. Pt aware.

## 2016-01-21 ENCOUNTER — Other Ambulatory Visit (INDEPENDENT_AMBULATORY_CARE_PROVIDER_SITE_OTHER): Payer: 59

## 2016-01-21 DIAGNOSIS — Z87448 Personal history of other diseases of urinary system: Secondary | ICD-10-CM | POA: Diagnosis not present

## 2016-01-21 LAB — URINALYSIS, ROUTINE W REFLEX MICROSCOPIC
Bilirubin Urine: NEGATIVE
Ketones, ur: NEGATIVE
LEUKOCYTES UA: NEGATIVE
Nitrite: NEGATIVE
PH: 6 (ref 5.0–8.0)
SPECIFIC GRAVITY, URINE: 1.02 (ref 1.000–1.030)
Total Protein, Urine: NEGATIVE
Urine Glucose: NEGATIVE
Urobilinogen, UA: 0.2 (ref 0.0–1.0)

## 2016-01-21 NOTE — Telephone Encounter (Signed)
Autumn, when you call pt with urine results will you please ask him this questions from Dr. Caryl NeverBurchette. Thanks

## 2016-01-22 NOTE — Telephone Encounter (Signed)
Tried calling pt with NA. 

## 2016-01-26 NOTE — Telephone Encounter (Signed)
Pt is aware via voicemail of Dr. Burchettes annotations. 

## 2016-03-25 ENCOUNTER — Telehealth: Payer: Self-pay | Admitting: Family Medicine

## 2016-03-25 NOTE — Telephone Encounter (Signed)
Request is from [facility] (90 day supply company). Last filled 11/13/15  #15

## 2016-03-27 NOTE — Telephone Encounter (Signed)
He has not taken regularly.  May refill #15 only-

## 2016-03-28 MED ORDER — CLONAZEPAM 1 MG PO TABS: 1.0000 mg | ORAL_TABLET | Freq: Every day | ORAL | 0 refills | Status: DC

## 2016-03-28 NOTE — Telephone Encounter (Signed)
Called to the pharmacy and left on machine.  Pt notified. 

## 2016-11-01 ENCOUNTER — Other Ambulatory Visit: Payer: Self-pay | Admitting: *Deleted

## 2016-11-01 MED ORDER — SERTRALINE HCL 50 MG PO TABS: 50.0000 mg | ORAL_TABLET | Freq: Every day | ORAL | 0 refills | Status: DC

## 2016-11-01 MED ORDER — LOSARTAN POTASSIUM 50 MG PO TABS: 50.0000 mg | ORAL_TABLET | Freq: Every day | ORAL | 0 refills | Status: DC

## 2017-01-17 HISTORY — PX: HEMORRHOID BANDING: SHX5850

## 2017-02-02 ENCOUNTER — Other Ambulatory Visit: Payer: Self-pay | Admitting: Family Medicine

## 2017-02-09 ENCOUNTER — Telehealth: Payer: Self-pay | Admitting: Family Medicine

## 2017-02-10 IMAGING — DX DG PORTABLE PELVIS
1 series · 1 of 1 positions shown · non-contrast
Comparison: CT of 02/07/2006

CLINICAL DATA: Postop for left hip arthroplasty.

EXAM:
PORTABLE PELVIS 1-2 VIEWS

[pelvis ap]
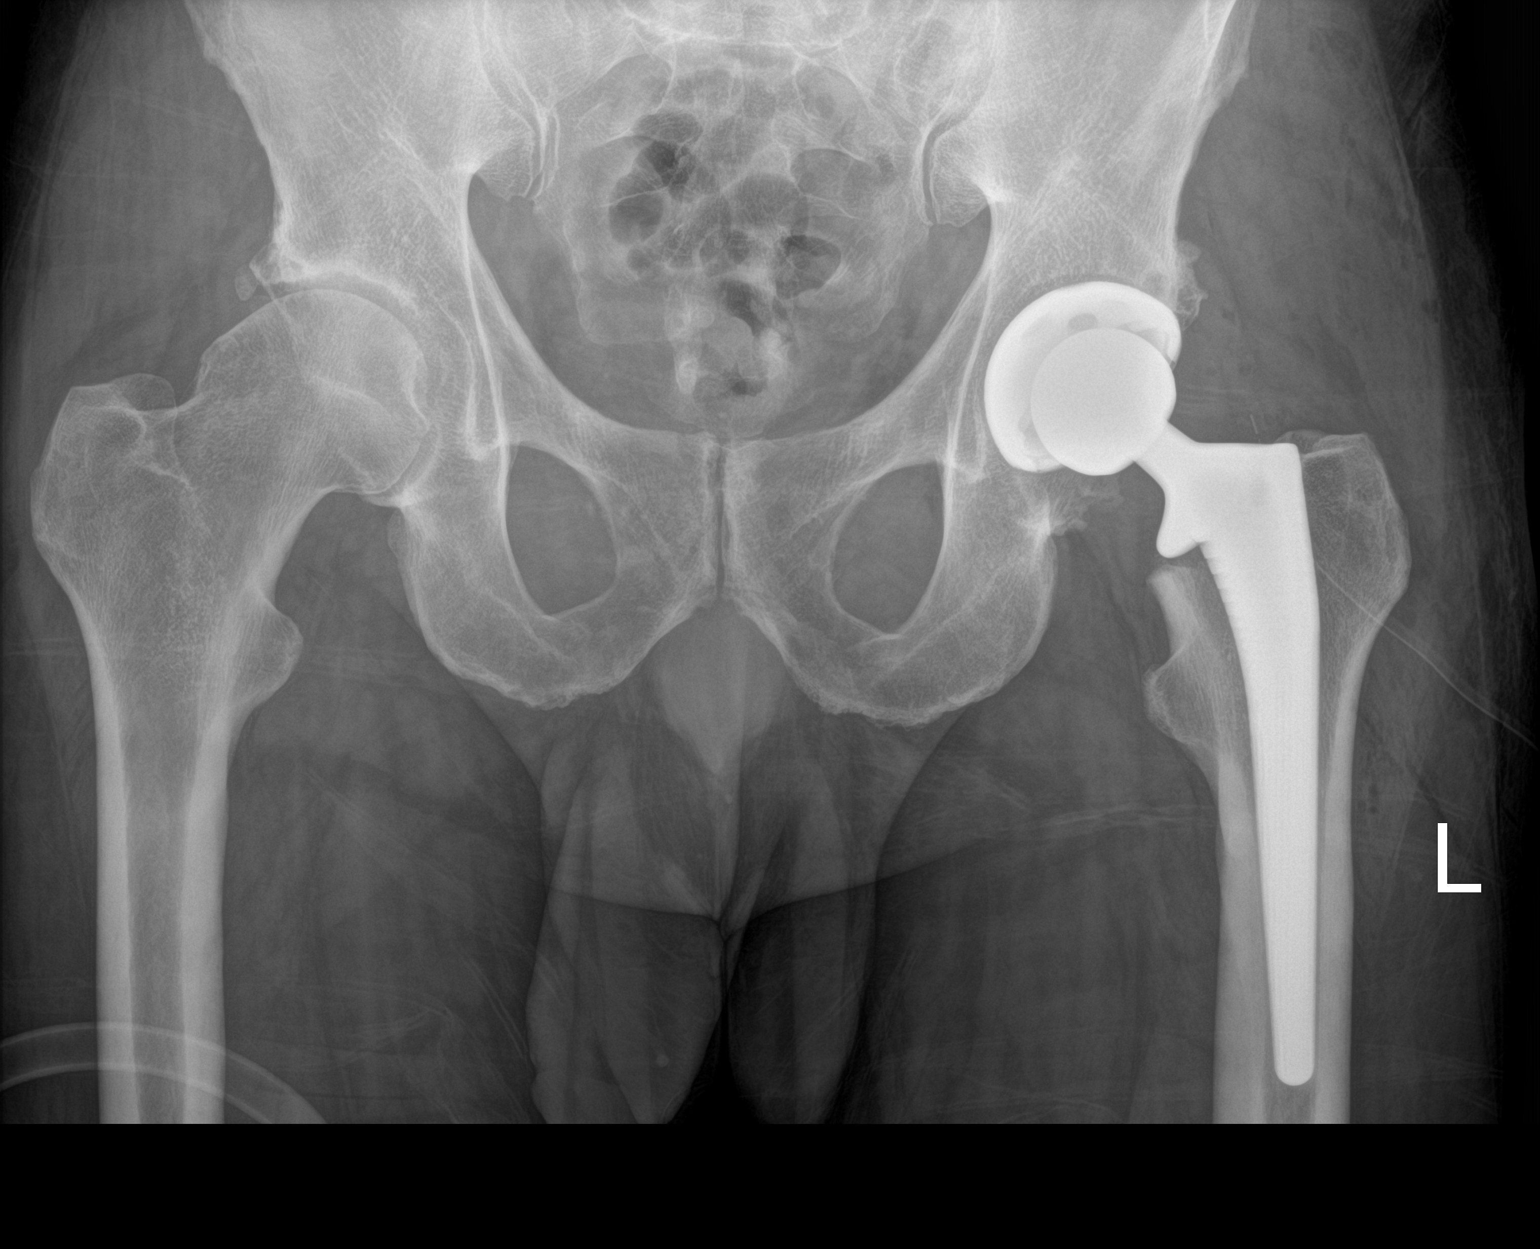

[1 of 1 positions shown; findings below may reference images not displayed]

FINDINGS: Status post left hip arthroplasty. No acute hardware complication or
periprosthetic fracture identified. Minimal right hip
osteoarthritis. degenerate sclerosis involves the symphysis pubis.
IMPRESSION: Expected appearance after left hip arthroplasty.

## 2017-02-14 NOTE — Telephone Encounter (Signed)
Patient checking status. Please advise °

## 2017-02-14 NOTE — Telephone Encounter (Signed)
Refills have been denied. Patient needs an office visit.

## 2017-02-15 ENCOUNTER — Other Ambulatory Visit: Payer: Self-pay | Admitting: Family Medicine

## 2017-02-15 MED ORDER — LOSARTAN POTASSIUM 50 MG PO TABS: 50.0000 mg | ORAL_TABLET | Freq: Every day | ORAL | 0 refills | Status: DC

## 2017-02-15 NOTE — Telephone Encounter (Addendum)
Patient is scheduled for 03/29/17 for CPE. Can he get a refill to last him till then? Upset that he was not aware of denial. Please advise

## 2017-02-15 NOTE — Addendum Note (Signed)
Addended by: Kern ReapVEREEN, Jasmon Graffam B on: 02/15/2017 12:24 PM   Modules accepted: Orders

## 2017-03-22 ENCOUNTER — Encounter: Payer: 59 | Admitting: Family Medicine

## 2017-03-24 ENCOUNTER — Encounter: Payer: Self-pay | Admitting: Family Medicine

## 2017-03-24 ENCOUNTER — Ambulatory Visit (INDEPENDENT_AMBULATORY_CARE_PROVIDER_SITE_OTHER): Payer: Managed Care, Other (non HMO) | Admitting: Family Medicine

## 2017-03-24 VITALS — BP 110/80 | HR 64 | Temp 98.3°F | Ht 73.25 in | Wt 230.3 lb

## 2017-03-24 DIAGNOSIS — Z0001 Encounter for general adult medical examination with abnormal findings: Secondary | ICD-10-CM

## 2017-03-24 DIAGNOSIS — Z125 Encounter for screening for malignant neoplasm of prostate: Secondary | ICD-10-CM

## 2017-03-24 DIAGNOSIS — J069 Acute upper respiratory infection, unspecified: Secondary | ICD-10-CM | POA: Diagnosis not present

## 2017-03-24 DIAGNOSIS — Z0000 Encounter for general adult medical examination without abnormal findings: Secondary | ICD-10-CM

## 2017-03-24 LAB — CBC WITH DIFFERENTIAL/PLATELET
BASOS PCT: 0.4 % (ref 0.0–3.0)
Basophils Absolute: 0 10*3/uL (ref 0.0–0.1)
EOS PCT: 2.1 % (ref 0.0–5.0)
Eosinophils Absolute: 0.1 10*3/uL (ref 0.0–0.7)
HCT: 45.6 % (ref 39.0–52.0)
HEMOGLOBIN: 15.8 g/dL (ref 13.0–17.0)
Lymphocytes Relative: 25.4 % (ref 12.0–46.0)
Lymphs Abs: 1.7 10*3/uL (ref 0.7–4.0)
MCHC: 34.7 g/dL (ref 30.0–36.0)
MCV: 96 fl (ref 78.0–100.0)
MONOS PCT: 11 % (ref 3.0–12.0)
Monocytes Absolute: 0.7 10*3/uL (ref 0.1–1.0)
Neutro Abs: 4 10*3/uL (ref 1.4–7.7)
Neutrophils Relative %: 61.1 % (ref 43.0–77.0)
Platelets: 183 10*3/uL (ref 150.0–400.0)
RBC: 4.75 Mil/uL (ref 4.22–5.81)
RDW: 12.8 % (ref 11.5–15.5)
WBC: 6.6 10*3/uL (ref 4.0–10.5)

## 2017-03-24 LAB — BASIC METABOLIC PANEL
BUN: 13 mg/dL (ref 6–23)
CO2: 28 mEq/L (ref 19–32)
Calcium: 9.7 mg/dL (ref 8.4–10.5)
Chloride: 104 mEq/L (ref 96–112)
Creatinine, Ser: 0.95 mg/dL (ref 0.40–1.50)
GFR: 86.8 mL/min (ref >60.00)
Glucose, Bld: 94 mg/dL (ref 70–99)
POTASSIUM: 4.2 meq/L (ref 3.5–5.1)
SODIUM: 141 meq/L (ref 135–145)

## 2017-03-24 LAB — HEPATIC FUNCTION PANEL
ALBUMIN: 4.2 g/dL (ref 3.5–5.2)
ALT: 34 U/L (ref 0–53)
AST: 34 U/L (ref 0–37)
Alkaline Phosphatase: 58 U/L (ref 39–117)
Bilirubin, Direct: 0.1 mg/dL (ref 0.0–0.3)
TOTAL PROTEIN: 7 g/dL (ref 6.0–8.3)
Total Bilirubin: 0.7 mg/dL (ref 0.2–1.2)

## 2017-03-24 LAB — PSA: PSA: 1.82 ng/mL (ref 0.10–4.00)

## 2017-03-24 LAB — LIPID PANEL
CHOLESTEROL: 176 mg/dL (ref 0–200)
HDL: 41.3 mg/dL (ref >39.00)
LDL Cholesterol: 107 mg/dL — ABNORMAL HIGH (ref 0–99)
NonHDL: 134.86
Total CHOL/HDL Ratio: 4
Triglycerides: 141 mg/dL (ref 0.0–149.0)
VLDL: 28.2 mg/dL (ref 0.0–40.0)

## 2017-03-24 LAB — TSH: TSH: 1.66 u[IU]/mL (ref 0.35–4.50)

## 2017-03-24 MED ORDER — HYDROCOD POLST-CPM POLST ER 10-8 MG/5ML PO SUER: 5.0000 mL | Freq: Two times a day (BID) | ORAL | 0 refills | Status: DC | PRN

## 2017-03-24 MED ORDER — SERTRALINE HCL 50 MG PO TABS: 50.0000 mg | ORAL_TABLET | Freq: Every day | ORAL | 3 refills | Status: DC

## 2017-03-24 MED ORDER — AMOXICILLIN-POT CLAVULANATE 875-125 MG PO TABS: 1.0000 | ORAL_TABLET | Freq: Two times a day (BID) | ORAL | 0 refills | Status: DC

## 2017-03-24 NOTE — Progress Notes (Signed)
Subjective:     Patient ID: Michael Bradford, male   DOB: May 12, 1960, 57 y.o.   MRN: [id number]  HPI Patient seen for complete physical. He also has concomitant upper respiratory illness. Started over week ago. Was doing some international travel. This was in [place]. He has cough which is severe at night. Not relieved with Tessalon Perles. [facility] and was prescribed that. He also has had some progressive left frontal and maxillary facial pain and yellowish discharge.  Colonoscopy is up-to-date. No history of Hep C screening. He has resumed some fairly regular exercise since left total hip replacement little over year ago.  Past Medical History:  Diagnosis Date  . ALLERGIC RHINITIS   . Anxiety    situational  . Herpes simplex   . Hypertension    eval at age 57  . Recurrent inguinal hernia    asymptomatic   Past Surgical History:  Procedure Laterality Date  . FINGER SURGERY  1970, 1971   times three  . Right eye trauma  1975  . SHOULDER SURGERY  12/21/10   right, ROTATOR CUFF REPAIR  . TOTAL HIP ARTHROPLASTY Left 07/08/2015   Procedure: LEFT TOTAL HIP ARTHROPLASTY ANTERIOR APPROACH;  Surgeon: Ollen GrossFrank Aluisio, MD;  Location: [facility];  Service: Orthopedics;  Laterality: Left;  Marland Kitchen. VASECTOMY  1998  . vocal polyps  1992, 1996   times two  . WISDOM TOOTH EXTRACTION  1976    reports that he quit smoking about 3 years ago. His smoking use included cigarettes. He quit after 10.00 years of use. he has never used smokeless tobacco. He reports that he drinks about 6.0 oz of alcohol per week. He reports that he does not use drugs. family history includes Arthritis in his mother; Colon polyps (age of onset: 2150) in his father; Diabetes in his mother; Heart disease (age of onset: 6970) in his father; Hyperlipidemia in his brother; Hypertension in his brother and father; Obesity in his father; Thyroid disease in his sister. No Known Allergies   Review of Systems  Constitutional: Negative  for activity change, appetite change, fatigue and fever.  HENT: Negative for congestion, ear pain and trouble swallowing.   Eyes: Negative for pain and visual disturbance.  Respiratory: Negative for cough, shortness of breath and wheezing.   Cardiovascular: Negative for chest pain and palpitations.  Gastrointestinal: Negative for abdominal distention, abdominal pain, blood in stool, constipation, diarrhea, nausea, rectal pain and vomiting.  Genitourinary: Negative for dysuria, hematuria and testicular pain.  Musculoskeletal: Negative for arthralgias and joint swelling.  Skin: Negative for rash.  Neurological: Negative for dizziness, syncope and headaches.  Hematological: Negative for adenopathy.  Psychiatric/Behavioral: Negative for confusion and dysphoric mood.       Objective:   Physical Exam  Constitutional: He is oriented to person, place, and time. He appears well-developed and well-nourished. No distress.  HENT:  Head: Normocephalic and atraumatic.  Right Ear: External ear normal.  Left Ear: External ear normal.  Mouth/Throat: Oropharynx is clear and moist.  Eyes: Conjunctivae and EOM are normal. Pupils are equal, round, and reactive to light.  Neck: Normal range of motion. Neck supple. No thyromegaly present.  Cardiovascular: Normal rate, regular rhythm and normal heart sounds.  No murmur heard. Pulmonary/Chest: No respiratory distress. He has no wheezes. He has no rales.  Abdominal: Soft. Bowel sounds are normal. He exhibits no distension and no mass. There is no tenderness. There is no rebound and no guarding.  Musculoskeletal: He exhibits no  edema.  Lymphadenopathy:    He has no cervical adenopathy.  Neurological: He is alert and oriented to person, place, and time. He displays normal reflexes. No cranial nerve deficit.  Skin: No rash noted.  Psychiatric: He has a normal mood and affect.       Assessment:     Physical exam. Several issues addressed as below  Upper  respiratory infection. Possible left maxillary sinusitis. Increasing cough but nonfocal exam    Plan:     -Obtain screening lab work -The natural history of prostate cancer and ongoing controversy regarding screening and potential treatment outcomes of prostate cancer has been discussed with the patient. The meaning of a false positive PSA and a false negative PSA has been discussed. He indicates understanding of the limitations of this screening test and wishes  to proceed with screening PSA testing. -Prescription for Limited Tussionex 1 teaspoon daily at bedtime for severe cough -Augmentin 875 mg twice daily for 10 days with printed prescription to fill if not improving over the next few days. He will give this a few more days of observation first  Kristian Covey MD [place] [facility]

## 2017-03-25 LAB — HEPATITIS C ANTIBODY
Hepatitis C Ab: NONREACTIVE
SIGNAL TO CUT-OFF: 0.01 (ref <1.00)

## 2017-03-29 ENCOUNTER — Encounter: Payer: 59 | Admitting: Family Medicine

## 2017-05-01 ENCOUNTER — Ambulatory Visit: Payer: 59 | Admitting: Internal Medicine

## 2017-05-03 NOTE — Progress Notes (Signed)
Chief Complaint  Patient presents with  . Wheezing    chest congestion, wheezing, cough, yellow mucus, taking doxycycline,    HPI: Michael Bradford 57 y.o.  SDA PCP NA     Chest sx traveling  In March     [place] and [place]  Took  Amoxicillin.  Back on the road   Many trip   Next to  Person with cough and  April 13 - 14 and \\when  tp mini clinc  and given  Doxycycline  And sweats and body aches better .   noever went away and then again and  Coughing and  Wheezing.    Achy feeling is better.   Feels mild sob.  Played .   Tennis   Last night ans a bit sob.     Sinus and allergies.     In past    No hx asathma  No tobacco at present .  Ex smoker No cp hemoptysis  ROS: See pertinent positives and negatives per HPI.  Past Medical History:  Diagnosis Date  . ALLERGIC RHINITIS   . Anxiety    situational  . Herpes simplex   . Hypertension    eval at age 42  . Recurrent inguinal hernia    asymptomatic    Family History  Problem Relation Age of Onset  . Arthritis Mother   . Diabetes Mother   . Hypertension Father   . Obesity Father   . Heart disease Father 48       MI  . Colon polyps Father 38  . Thyroid disease Sister   . Hyperlipidemia Brother   . Hypertension Brother   . Colon cancer Neg Hx     Social History   Socioeconomic History  . Marital status: Divorced    Spouse name: Not on file  . Number of children: Not on file  . Years of education: Not on file  . Highest education level: Not on file  Occupational History    Employer: [employer]  Social Needs  . Financial resource strain: Not on file  . Food insecurity:    Worry: Not on file    Inability: Not on file  . Transportation needs:    Medical: Not on file    Non-medical: Not on file  Tobacco Use  . Smoking status: Former Smoker    Years: 10.00    Types: Cigarettes    Last attempt to quit: 01/16/2014    Years since quitting: 3.2  . Smokeless tobacco: Never Used  . Tobacco comment:  SICUAL SMOKER ONLY  Substance and Sexual Activity  . Alcohol use: Yes    Alcohol/week: 6.0 oz    Types: 10 Glasses of wine per week    Comment: 2 GLASSES OF WINE PER DAY  . Drug use: No  . Sexual activity: Not on file  Lifestyle  . Physical activity:    Days per week: Not on file    Minutes per session: Not on file  . Stress: Not on file  Relationships  . Social connections:    Talks on phone: Not on file    Gets together: Not on file    Attends religious service: Not on file    Active member of club or organization: Not on file    Attends meetings of clubs or organizations: Not on file    Relationship status: Not on file  Other Topics Concern  . Not on file  Social History Narrative  .  Not on file    Outpatient Medications Prior to Visit  Medication Sig Dispense Refill  . acyclovir (ZOVIRAX) 200 MG capsule TAKE 1 CAPSULE (200 MG TOTAL) BY MOUTH DAILY AS NEEDED. 90 capsule 1  . chlorpheniramine-HYDROcodone (TUSSIONEX PENNKINETIC ER) 10-8 MG/5ML SUER Take 5 mLs by mouth every 12 (twelve) hours as needed for cough. 120 mL 0  . clonazePAM (KLONOPIN) 1 MG tablet Take 1 tablet (1 mg total) by mouth at bedtime. 15 tablet 0  . losartan (COZAAR) 50 MG tablet Take 1 tablet (50 mg total) by mouth daily. 90 tablet 0  . sertraline (ZOLOFT) 50 MG tablet Take 1 tablet (50 mg total) by mouth daily. 90 tablet 3  . tetrahydrozoline 0.05 % ophthalmic solution Place 1 drop into both eyes 4 (four) times daily as needed (For eye irritation.).    Marland Kitchen. promethazine-dextromethorphan (PROMETHAZINE-DM) 6.25-15 MG/5ML syrup Take 5 mLs by mouth as needed for cough.    Marland Kitchen. albuterol (PROVENTIL HFA;VENTOLIN HFA) 108 (90 Base) MCG/ACT inhaler Inhale 1-2 puffs into the lungs as needed for wheezing.  0  . amoxicillin (AMOXIL) 500 MG capsule Take 4 tablets one hour prior to procedure (Patient not taking: Reported on 05/04/2017) 4 capsule 0  . doxycycline (VIBRA-TABS) 100 MG tablet Take 1 tablet by mouth 2 (two) times  daily.  0  . amoxicillin-clavulanate (AUGMENTIN) 875-125 MG tablet Take 1 tablet by mouth 2 (two) times daily. (Patient not taking: Reported on 05/04/2017) 20 tablet 0   No facility-administered medications prior to visit.      EXAM:  BP 124/76 (BP Location: Left Arm, Patient Position: Sitting, Cuff Size: Normal)   Pulse 81   Temp 98.5 F (36.9 C) (Oral)   Wt 225 lb 12.8 oz (102.4 kg)   SpO2 97%   BMI 29.59 kg/m   Body mass index is 29.59 kg/m. WDWN in NAD  quiet respirations; mildly congested  somewhat hoarse. Non toxic . HEENT: Normocephalic ;atraumatic , Eyes;  PERRL, EOMs  Full, lids and conjunctiva clear,,Ears: no deformities, canals nl, TM landmarks normal, Nose: no deformity or discharge but congested;face minimally tender Mouth : OP clear without lesion or edema . Neck: Supple without adenopathy or masses or bruits Chest:    Exp  wheezes w good air movement   No rales rhonchi CV:  S1-S2 no gallops or murmurs peripheral perfusion is normal Skin :nl perfusion and no acute rashes    ASSESSMENT AND PLAN:  Discussed the following assessment and plan:  Wheezy bronchitis  Acute respiratory infection  Sinusitis, unspecified chronicity, unspecified location Most likely secondary     At this time no evidence of pna but on doxy and   No fever ?   Viral and allergy triggers  Certainly could have had the flu this past  Weekend.    Expectant management.  And fu pcp or as needed  -Patient advised to return or notify health care team  if symptoms worsen ,persist or new concerns arise.  Patient Instructions  You are wheezing  And advise   Prednisone  And  Albuterol  Every 6 hours as long as you are wheezing  inhaler .  At this time .   Stay on the doxy  'this doesn't seem like pneumonia .  At this time but   If you get   Worse  Fever   Short pf breath  Then seek care and exam .   mucinex   Plain in day and fluids    Or mucinex  dm .     Bronchospasm, Adult Bronchospasm is a  tightening of the airways going into the lungs. During an episode, it may be harder to breathe. You may cough, and you may make a whistling sound when you breathe (wheeze). This condition often affects people with asthma. What are the causes? This condition is caused by swelling and irritation in the airways. It can be triggered by:  An infection (common).  Seasonal allergies.  An allergic reaction.  Exercise.  Irritants. These include pollution, cigarette smoke, strong odors, aerosol sprays, and paint fumes.  Weather changes. Winds increase molds and pollens in the air. Cold air may cause swelling.  Stress and emotional upset.  What are the signs or symptoms? Symptoms of this condition include:  Wheezing. If the episode was triggered by an allergy, wheezing may start right away or hours later.  Nighttime coughing.  Frequent or severe coughing with a simple cold.  Chest tightness.  Shortness of breath.  Decreased ability to exercise.  How is this diagnosed? This condition is usually diagnosed with a review of your medical history and a physical exam. Tests, such as lung function tests, are sometimes done to look for other conditions. The need for a chest X-ray depends on where the wheezing occurs and whether it is the first time you have wheezed. How is this treated? This condition may be treated with:  Inhaled medicines. These open up the airways and help you breathe. They can be taken with an inhaler or a nebulizer device.  Corticosteroid medicines. These may be given for severe bronchospasm, usually when it is associated with asthma.  Avoiding triggers, such as irritants, infection, or allergies.  Follow these instructions at home: Medicines  Take over-the-counter and prescription medicines only as told by your health care provider.  If you need to use an inhaler or nebulizer to take your medicine, ask your health care provider to explain how to use it correctly.  If you were given a spacer, always use it with your inhaler. Lifestyle  Reduce the number of triggers in your home. To do this: ? Change your heating and air conditioning filter at least once a month. ? Limit your use of fireplaces and wood stoves. ? Do not smoke. Do not allow smoking in your home. ? Avoid using perfumes and fragrances. ? Get rid of pests, such as roaches and mice, and their droppings. ? Remove any mold from your home. ? Keep your house clean and dust free. Use unscented cleaning products. ? Replace carpet with wood, tile, or vinyl flooring. Carpet can trap dander and dust. ? Use allergy-proof pillows, mattress covers, and box spring covers. ? Wash bed sheets and blankets every week in hot water. Dry them in a dryer. ? Use blankets that are made of polyester or cotton. ? Wash your hands often. ? Do not allow pets in your bedroom.  Avoid breathing in cold air when you exercise. General instructions  Have a plan for seeking medical care. Know when to call your health care provider and local emergency services, and where to get emergency care.  Stay up to date on your immunizations.  When you have an episode of bronchospasm, stay calm. Try to relax and breathe more slowly.  If you have asthma, make sure you have an asthma action plan.  Keep all follow-up visits as told by your health care provider. This is important. Contact a health care provider if:  You have muscle aches.  You have  chest pain.  The mucus that you cough up (sputum) changes from clear or white to yellow, green, gray, or bloody.  You have a fever.  Your sputum gets thicker. Get help right away if:  Your wheezing and coughing get worse, even after you take your prescribed medicines.  It gets even harder to breathe.  You develop severe chest pain. Summary  Bronchospasm is a tightening of the airways going into the lungs.  During an episode of bronchospasm, you may have a harder time  breathing. You may cough and make a whistling sound when you breathe (wheeze).  Avoid exposure to triggers such as smoke, dust, mold, animal dander, and fragrances.  When you have an episode of bronchospasm, stay calm. Try to relax and breathe more slowly. This information is not intended to replace advice given to you by your health care provider. Make sure you discuss any questions you have with your health care provider. Document Released: 01/06/2003 Document Revised: 12/31/2015 Document Reviewed: 12/31/2015 Elsevier Interactive Patient Education  2017 [employer].         Berry. Baley Lorimer M.D.

## 2017-05-04 ENCOUNTER — Ambulatory Visit: Payer: Managed Care, Other (non HMO) | Admitting: Internal Medicine

## 2017-05-04 ENCOUNTER — Encounter: Payer: Self-pay | Admitting: Internal Medicine

## 2017-05-04 VITALS — BP 124/76 | HR 81 | Temp 98.5°F | Wt 225.8 lb

## 2017-05-04 DIAGNOSIS — J40 Bronchitis, not specified as acute or chronic: Secondary | ICD-10-CM

## 2017-05-04 DIAGNOSIS — J329 Chronic sinusitis, unspecified: Secondary | ICD-10-CM

## 2017-05-04 DIAGNOSIS — J22 Unspecified acute lower respiratory infection: Secondary | ICD-10-CM | POA: Diagnosis not present

## 2017-05-04 MED ORDER — PROMETHAZINE-DM 6.25-15 MG/5ML PO SYRP: 5.0000 mL | ORAL_SOLUTION | ORAL | 0 refills | Status: AC | PRN

## 2017-05-04 MED ORDER — PREDNISONE 20 MG PO TABS: 20.0000 mg | ORAL_TABLET | Freq: Two times a day (BID) | ORAL | 0 refills | Status: DC

## 2017-05-04 NOTE — Patient Instructions (Addendum)
You are wheezing  And advise   Prednisone  And  Albuterol  Every 6 hours as long as you are wheezing  inhaler .  At this time .   Stay on the doxy  'this doesn't seem like pneumonia .  At this time but   If you get   Worse  Fever   Short pf breath  Then seek care and exam .   mucinex   Plain in day and fluids    Or mucinex dm .     Bronchospasm, Adult Bronchospasm is a tightening of the airways going into the lungs. During an episode, it may be harder to breathe. You may cough, and you may make a whistling sound when you breathe (wheeze). This condition often affects people with asthma. What are the causes? This condition is caused by swelling and irritation in the airways. It can be triggered by:  An infection (common).  Seasonal allergies.  An allergic reaction.  Exercise.  Irritants. These include pollution, cigarette smoke, strong odors, aerosol sprays, and paint fumes.  Weather changes. Winds increase molds and pollens in the air. Cold air may cause swelling.  Stress and emotional upset.  What are the signs or symptoms? Symptoms of this condition include:  Wheezing. If the episode was triggered by an allergy, wheezing may start right away or hours later.  Nighttime coughing.  Frequent or severe coughing with a simple cold.  Chest tightness.  Shortness of breath.  Decreased ability to exercise.  How is this diagnosed? This condition is usually diagnosed with a review of your medical history and a physical exam. Tests, such as lung function tests, are sometimes done to look for other conditions. The need for a chest X-ray depends on where the wheezing occurs and whether it is the first time you have wheezed. How is this treated? This condition may be treated with:  Inhaled medicines. These open up the airways and help you breathe. They can be taken with an inhaler or a nebulizer device.  Corticosteroid medicines. These may be given for severe bronchospasm, usually  when it is associated with asthma.  Avoiding triggers, such as irritants, infection, or allergies.  Follow these instructions at home: Medicines  Take over-the-counter and prescription medicines only as told by your health care provider.  If you need to use an inhaler or nebulizer to take your medicine, ask your health care provider to explain how to use it correctly. If you were given a spacer, always use it with your inhaler. Lifestyle  Reduce the number of triggers in your home. To do this: ? Change your heating and air conditioning filter at least once a month. ? Limit your use of fireplaces and wood stoves. ? Do not smoke. Do not allow smoking in your home. ? Avoid using perfumes and fragrances. ? Get rid of pests, such as roaches and mice, and their droppings. ? Remove any mold from your home. ? Keep your house clean and dust free. Use unscented cleaning products. ? Replace carpet with wood, tile, or vinyl flooring. Carpet can trap dander and dust. ? Use allergy-proof pillows, mattress covers, and box spring covers. ? Wash bed sheets and blankets every week in hot water. Dry them in a dryer. ? Use blankets that are made of polyester or cotton. ? Wash your hands often. ? Do not allow pets in your bedroom.  Avoid breathing in cold air when you exercise. General instructions  Have a plan for seeking medical care.  Know when to call your health care provider and local emergency services, and where to get emergency care.  Stay up to date on your immunizations.  When you have an episode of bronchospasm, stay calm. Try to relax and breathe more slowly.  If you have asthma, make sure you have an asthma action plan.  Keep all follow-up visits as told by your health care provider. This is important. Contact a health care provider if:  You have muscle aches.  You have chest pain.  The mucus that you cough up (sputum) changes from clear or white to yellow, green, gray, or  bloody.  You have a fever.  Your sputum gets thicker. Get help right away if:  Your wheezing and coughing get worse, even after you take your prescribed medicines.  It gets even harder to breathe.  You develop severe chest pain. Summary  Bronchospasm is a tightening of the airways going into the lungs.  During an episode of bronchospasm, you may have a harder time breathing. You may cough and make a whistling sound when you breathe (wheeze).  Avoid exposure to triggers such as smoke, dust, mold, animal dander, and fragrances.  When you have an episode of bronchospasm, stay calm. Try to relax and breathe more slowly. This information is not intended to replace advice given to you by your health care provider. Make sure you discuss any questions you have with your health care provider. Document Released: 01/06/2003 Document Revised: 12/31/2015 Document Reviewed: 12/31/2015 Elsevier Interactive Patient Education  2017 [employer].

## 2017-06-04 ENCOUNTER — Other Ambulatory Visit: Payer: Self-pay | Admitting: Family Medicine

## 2017-06-28 ENCOUNTER — Ambulatory Visit: Payer: Managed Care, Other (non HMO) | Admitting: Internal Medicine

## 2017-08-23 ENCOUNTER — Ambulatory Visit: Payer: Managed Care, Other (non HMO) | Admitting: Internal Medicine

## 2017-08-23 ENCOUNTER — Encounter: Payer: Self-pay | Admitting: Internal Medicine

## 2017-08-23 ENCOUNTER — Encounter

## 2017-08-23 VITALS — BP 132/90 | HR 80 | Ht 72.0 in | Wt 231.0 lb

## 2017-08-23 DIAGNOSIS — K641 Second degree hemorrhoids: Secondary | ICD-10-CM | POA: Insufficient documentation

## 2017-08-23 NOTE — Patient Instructions (Addendum)
  HEMORRHOID BANDING PROCEDURE    FOLLOW-UP CARE   1. The procedure you have had should have been relatively painless since the banding of the area involved does not have nerve endings and there is no pain sensation.  The rubber band cuts off the blood supply to the hemorrhoid and the band may fall off as soon as 48 hours after the banding (the band may occasionally be seen in the toilet bowl following a bowel movement). You may notice a temporary feeling of fullness in the rectum which should respond adequately to plain Tylenol or Motrin.  2. Following the banding, avoid strenuous exercise that evening and resume full activity the next day.  A sitz bath (soaking in a warm tub) or bidet is soothing, and can be useful for cleansing the area after bowel movements.     3. To avoid constipation, take two tablespoons of natural wheat bran, natural oat bran, flax, Benefiber or any over the counter fiber supplement and increase your water intake to 7-8 glasses daily.    4. Unless you have been prescribed anorectal medication, do not put anything inside your rectum for two weeks: No suppositories, enemas, fingers, etc.  5. Occasionally, you may have more bleeding than usual after the banding procedure.  This is often from the untreated hemorrhoids rather than the treated one.  Don't be concerned if there is a tablespoon or so of blood.  If there is more blood than this, lie flat with your bottom higher than your head and apply an ice pack to the area. If the bleeding does not stop within a half an hour or if you feel faint, call our office at [phone] or go to the emergency room.  6. Problems are not common; however, if there is a substantial amount of bleeding, severe pain, chills, fever or difficulty passing urine (very rare) or other problems, you should call us at [phone] or report to the nearest emergency room.  7. Do not stay seated continuously for more than 2-3 hours for a day or  two after the procedure.  Tighten your buttock muscles 10-15 times every two hours and take 10-15 deep breaths every 1-2 hours.  Do not spend more than a few minutes on the toilet if you cannot empty your bowel; instead re-visit the toilet at a later time.    We are giving you a handout on Benefiber to read and follow.  Follow up with Dr Leone PayorGessner as needed.    I appreciate the opportunity to care for you. Stan Headarl Gessner, MD, [facility]

## 2017-08-23 NOTE — Assessment & Plan Note (Addendum)
All 3 columns banded today.  Start Benefiber 1 tablespoon nightly. Note that the band on the right anterior was only slightly on, that was the smallest hemorrhoid He will see how things are and if he still has problems after 2 months he will call back and we will consider additional banding

## 2017-08-23 NOTE — Progress Notes (Signed)
Michael DenverMichael A Bradford 57 y.o. 1960-11-07 [id number]  Assessment & Plan:   Prolapsed internal hemorrhoids, grade 2, bleeding All 3 columns banded today.  Start Benefiber 1 tablespoon nightly. Note that the band on the right anterior was only slightly on, that was the smallest hemorrhoid He will see how things are and if he still has problems after 2 months he will call back and we will consider additional banding  I appreciate the opportunity to care for this patient. CC: Kristian CoveyBurchette, Bruce W, MD    Subjective:   Chief Complaint: Rectal bleeding and hemorrhoids  HPI The patient is here, he has intermittent rectal bleeding he travels a lot and his bowels become irregular.  When he finally defecates he will have a large stool and copious red blood and some fecal smearing after that.  No pain.  He does have bulges protruding from the rectum, consistent with prolapsing hemorrhoids.  Colonoscopy in 2015-. No Known Allergies Current Meds  Medication Sig  . acyclovir (ZOVIRAX) 200 MG capsule TAKE 1 CAPSULE (200 MG TOTAL) BY MOUTH DAILY AS NEEDED.  Marland Kitchen. albuterol (PROVENTIL HFA;VENTOLIN HFA) 108 (90 Base) MCG/ACT inhaler Inhale 1-2 puffs into the lungs as needed for wheezing.  Marland Kitchen. amoxicillin (AMOXIL) 500 MG capsule Take 4 tablets one hour prior to procedure  . clonazePAM (KLONOPIN) 1 MG tablet Take 1 tablet (1 mg total) by mouth at bedtime.  Marland Kitchen. losartan (COZAAR) 50 MG tablet TAKE 1 TABLET BY MOUTH EVERY DAY  . sertraline (ZOLOFT) 50 MG tablet Take 1 tablet (50 mg total) by mouth daily.  Marland Kitchen. tetrahydrozoline 0.05 % ophthalmic solution Place 1 drop into both eyes 4 (four) times daily as needed (For eye irritation.).   Past Medical History:  Diagnosis Date  . ALLERGIC RHINITIS   . Anxiety    situational  . Herpes simplex   . Hypertension    eval at age 57  . Recurrent inguinal hernia    asymptomatic   Past Surgical History:  Procedure Laterality Date  . COLONOSCOPY  02/10/2013  . FINGER  SURGERY  1970, 1971   times three  . Right eye trauma  1975  . SHOULDER SURGERY  12/21/10   right, ROTATOR CUFF REPAIR  . TOTAL HIP ARTHROPLASTY Left 07/08/2015   Procedure: LEFT TOTAL HIP ARTHROPLASTY ANTERIOR APPROACH;  Surgeon: Ollen GrossFrank Aluisio, MD;  Location: [facility];  Service: Orthopedics;  Laterality: Left;  Marland Kitchen. VASECTOMY  1998  . vocal polyps  1992, 1996   times two  . 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      2 glasses of wine daily, former smoker, no substance abuse reported   family history includes Arthritis in his mother; Colon polyps (age of onset: 1350) in his father; Diabetes in his mother; Heart disease (age of onset: 4270) in his father; Hyperlipidemia in his brother; Hypertension in his brother and father; Obesity in his father; Thyroid disease in his sister.   Review of Systems   Objective:   Physical Exam BP 132/90   Pulse 80   Ht 6' (1.829 m)   Wt 231 lb (104.8 kg)   BMI 31.33 kg/m   Rectal - small tags all 3 positions NL DRE  Anoscopy - Gr 2 internal hemorrhoids all positions with submucosal heme  PROCEDURE NOTE: The patient  presents with symptomatic grade 2 hemorrhoids, requesting rubber band ligation of his/her hemorrhoidal disease.  All risks, benefits and alternative forms of therapy were described and informed consent was obtained.   The anorectum was pre-medicated with 0.125% nitroglycerin and 5% lidocaine topical The decision was made to band all 3 columns of internal hemorrhoids, and the [facility] O'Regan System was used to perform band ligation without complication.  Digital anorectal examination was then performed to assure proper positioning of the band, and to adjust the banded tissue as required.  The patient was discharged home without pain or other issues.  Dietary and  behavioral recommendations were given and along with follow-up instructions.     The following adjunctive treatments were recommended:  Benefiber 1 tablespoon nightly  The patient will return as needed for  follow-up and possible additional banding as required. No complications were encountered and the patient tolerated the procedure well.

## 2017-12-04 ENCOUNTER — Other Ambulatory Visit: Payer: Self-pay | Admitting: Family Medicine

## 2018-03-04 ENCOUNTER — Other Ambulatory Visit: Payer: Self-pay | Admitting: Family Medicine

## 2018-03-19 ENCOUNTER — Telehealth: Payer: Self-pay

## 2018-03-19 ENCOUNTER — Telehealth: Payer: Self-pay | Admitting: Family Medicine

## 2018-03-19 MED ORDER — VALSARTAN 80 MG PO TABS: 80.0000 mg | ORAL_TABLET | Freq: Every day | ORAL | 0 refills | Status: DC

## 2018-03-19 NOTE — Telephone Encounter (Signed)
Medication has been sent to the pharmacy requested. °

## 2018-03-19 NOTE — Telephone Encounter (Signed)
Please advise 

## 2018-03-19 NOTE — Telephone Encounter (Addendum)
Copied from CRM [id number]. Topic: General - Other >> Mar 19, 2018 12:00 PM Leafy Ro wrote: Reason for CRM: Pt is calling and losartan has been recalled. Pt needs new medication sent to [facility]. Pt has physical sch for 04-03-2018

## 2018-03-19 NOTE — Telephone Encounter (Signed)
Change to valsartan 80 mg once daily

## 2018-03-21 NOTE — Telephone Encounter (Signed)
These are completely different drugs.  Starting dose for Losartan generally 50 mg and for Valsartan is 80 mg    We are not increasing the amount of medication he is on.

## 2018-03-21 NOTE — Telephone Encounter (Signed)
Please see message. °

## 2018-03-21 NOTE — Telephone Encounter (Signed)
Called patient and LMOVM to return call  Ok for [facility] to Discuss results / PCP / recommendations / Schedule patient  Per Dr. Caryl Never in response to his question: These are completely different drugs.  Starting dose for Losartan generally 50 mg and for Valsartan is 80 mg    We are not increasing the amount of medication he is on.    CRM Created.

## 2018-03-21 NOTE — Telephone Encounter (Signed)
Patient called to ask the doctor about his medication, valsartan (DIOVAN) 80 MG tablet.  He is concerned about the 80 mg as opposed to his former medication for Losartan 50 mg.  Please advise and call patient to explain the difference in mgs.  CB# [phone]

## 2018-03-22 NOTE — Telephone Encounter (Signed)
Patient returning call best # [phone]

## 2018-03-22 NOTE — Telephone Encounter (Signed)
Patient returned call and was notified of PCP response. Patient is requesting a 90 day supply due to his travel (job) schedule. Told patient I would send request to PCP.

## 2018-03-22 NOTE — Telephone Encounter (Signed)
Attempted to return his call.   Message left for him to call office back for Dr. Lucie Leather message to him.

## 2018-03-23 ENCOUNTER — Other Ambulatory Visit: Payer: Self-pay

## 2018-03-23 MED ORDER — VALSARTAN 80 MG PO TABS: 80.0000 mg | ORAL_TABLET | Freq: Every day | ORAL | 0 refills | Status: DC

## 2018-03-23 NOTE — Telephone Encounter (Signed)
A 90 day supply has been sent to the pharmacy for the patient.

## 2018-03-26 ENCOUNTER — Telehealth: Payer: Self-pay | Admitting: Family Medicine

## 2018-03-26 NOTE — Telephone Encounter (Signed)
Called patient and gave him the message from Dr. Caryl Never. Patient stated that he will do this and he has a physical next week. Patient verbalized an understanding.

## 2018-03-26 NOTE — Telephone Encounter (Signed)
Please see message.  Please advise. 

## 2018-03-26 NOTE — Telephone Encounter (Signed)
Please advise 

## 2018-03-26 NOTE — Telephone Encounter (Signed)
Copied from CRM [id number]. Topic: General - Other >> Mar 26, 2018  9:20 AM Tamela Oddi wrote: Reason for CRM: Patient called to inform the doctor that the new BP medication, valsartan (DIOVAN) 80 MG tablet, is not working for him.  He stated that his BP has been about 25+ points higher since he started taking it.  Please advise and call patient at [phone]

## 2018-03-26 NOTE — Telephone Encounter (Signed)
I would increase the valsartan to 160 mg once daily.  If blood pressure not improved in the next 2 to 3 weeks recommend follow-up to reassess

## 2018-04-02 ENCOUNTER — Other Ambulatory Visit: Payer: Self-pay | Admitting: Family Medicine

## 2018-04-03 ENCOUNTER — Ambulatory Visit (INDEPENDENT_AMBULATORY_CARE_PROVIDER_SITE_OTHER): Payer: 59 | Admitting: Family Medicine

## 2018-04-03 ENCOUNTER — Other Ambulatory Visit: Payer: Self-pay

## 2018-04-03 ENCOUNTER — Encounter: Payer: Self-pay | Admitting: Family Medicine

## 2018-04-03 VITALS — BP 140/80 | HR 68 | Temp 98.2°F | Ht 72.0 in | Wt 232.4 lb

## 2018-04-03 DIAGNOSIS — Z23 Encounter for immunization: Secondary | ICD-10-CM | POA: Diagnosis not present

## 2018-04-03 DIAGNOSIS — Z125 Encounter for screening for malignant neoplasm of prostate: Secondary | ICD-10-CM

## 2018-04-03 DIAGNOSIS — Z0000 Encounter for general adult medical examination without abnormal findings: Secondary | ICD-10-CM | POA: Diagnosis not present

## 2018-04-03 LAB — CBC WITH DIFFERENTIAL/PLATELET
Basophils Absolute: 0 10*3/uL (ref 0.0–0.1)
Basophils Relative: 0.3 % (ref 0.0–3.0)
EOS PCT: 2.7 % (ref 0.0–5.0)
Eosinophils Absolute: 0.1 10*3/uL (ref 0.0–0.7)
HCT: 47.6 % (ref 39.0–52.0)
Hemoglobin: 16.4 g/dL (ref 13.0–17.0)
Lymphocytes Relative: 37.9 % (ref 12.0–46.0)
Lymphs Abs: 1.3 10*3/uL (ref 0.7–4.0)
MCHC: 34.5 g/dL (ref 30.0–36.0)
MCV: 96.2 fl (ref 78.0–100.0)
Monocytes Absolute: 0.5 10*3/uL (ref 0.1–1.0)
Monocytes Relative: 14.4 % — ABNORMAL HIGH (ref 3.0–12.0)
Neutro Abs: 1.6 10*3/uL (ref 1.4–7.7)
Neutrophils Relative %: 44.7 % (ref 43.0–77.0)
Platelets: 143 10*3/uL — ABNORMAL LOW (ref 150.0–400.0)
RBC: 4.95 Mil/uL (ref 4.22–5.81)
RDW: 12.6 % (ref 11.5–15.5)
WBC: 3.5 10*3/uL — ABNORMAL LOW (ref 4.0–10.5)

## 2018-04-03 LAB — HEPATIC FUNCTION PANEL
ALT: 31 U/L (ref 0–53)
AST: 30 U/L (ref 0–37)
Albumin: 4.4 g/dL (ref 3.5–5.2)
Alkaline Phosphatase: 59 U/L (ref 39–117)
Bilirubin, Direct: 0.2 mg/dL (ref 0.0–0.3)
TOTAL PROTEIN: 6.9 g/dL (ref 6.0–8.3)
Total Bilirubin: 0.9 mg/dL (ref 0.2–1.2)

## 2018-04-03 LAB — BASIC METABOLIC PANEL
BUN: 16 mg/dL (ref 6–23)
CO2: 32 mEq/L (ref 19–32)
Calcium: 9.5 mg/dL (ref 8.4–10.5)
Chloride: 105 mEq/L (ref 96–112)
Creatinine, Ser: 0.97 mg/dL (ref 0.40–1.50)
GFR: 79.44 mL/min (ref >60.00)
GLUCOSE: 99 mg/dL (ref 70–99)
Potassium: 4.9 mEq/L (ref 3.5–5.1)
Sodium: 141 mEq/L (ref 135–145)

## 2018-04-03 LAB — PSA: PSA: 1.56 ng/mL (ref 0.10–4.00)

## 2018-04-03 LAB — LIPID PANEL
Cholesterol: 205 mg/dL — ABNORMAL HIGH (ref 0–200)
HDL: 44.6 mg/dL (ref >39.00)
LDL Cholesterol: 138 mg/dL — ABNORMAL HIGH (ref 0–99)
NonHDL: 160.87
Total CHOL/HDL Ratio: 5
Triglycerides: 115 mg/dL (ref 0.0–149.0)
VLDL: 23 mg/dL (ref 0.0–40.0)

## 2018-04-03 LAB — TSH: TSH: 2.47 u[IU]/mL (ref 0.35–4.50)

## 2018-04-03 MED ORDER — VALSARTAN 160 MG PO TABS: 160.0000 mg | ORAL_TABLET | Freq: Every day | ORAL | 3 refills | Status: DC

## 2018-04-03 NOTE — Progress Notes (Signed)
Subjective:     Patient ID: Michael Bradford, male   DOB: [birth year], 58 y.o.   MRN: [id number]  HPI Patient is seen for physical exam.  He has hypertension recently poorly controlled and we just recently increased his Diovan from 80 to 160 mg daily. Generally feels well.  He had colonoscopy 2015 which was normal.  Tetanus 2017.  Hepatitis C screen last year normal.  No history of shingles vaccine.  He is interested in pursuing that.  Past Medical History:  Diagnosis Date  . ALLERGIC RHINITIS   . Anxiety    situational  . Herpes simplex   . Hypertension    eval at age 42  . Recurrent inguinal hernia    asymptomatic   Past Surgical History:  Procedure Laterality Date  . COLONOSCOPY  02/10/2013  . FINGER SURGERY  1970, 1971   times three  . Right eye trauma  1975  . SHOULDER SURGERY  12/21/10   right, ROTATOR CUFF REPAIR  . TOTAL HIP ARTHROPLASTY Left 07/08/2015   Procedure: LEFT TOTAL HIP ARTHROPLASTY ANTERIOR APPROACH;  Surgeon: Ollen Gross, MD;  Location: [facility];  Service: Orthopedics;  Laterality: Left;  Marland Kitchen VASECTOMY  1998  . vocal polyps  1992, 1996   times two  . WISDOM TOOTH EXTRACTION  1976    reports that he quit smoking about 4 years ago. His smoking use included cigarettes. He quit after 10.00 years of use. He has never used smokeless tobacco. He reports current alcohol use of about 10.0 standard drinks of alcohol per week. He reports that he does not use drugs. family history includes Arthritis in his mother; Colon polyps (age of onset: 26) in his father; Diabetes in his mother; Heart disease (age of onset: 37) in his father; Hyperlipidemia in his brother; Hypertension in his brother and father; Obesity in his father; Thyroid disease in his sister. No Known Allergies    Review of Systems  Constitutional: Negative for activity change, appetite change, fatigue and fever.  HENT: Negative for congestion, ear pain and trouble swallowing.   Eyes: Negative for pain and  visual disturbance.  Respiratory: Negative for cough, shortness of breath and wheezing.   Cardiovascular: Negative for chest pain and palpitations.  Gastrointestinal: Negative for abdominal distention, abdominal pain, blood in stool, constipation, diarrhea, nausea, rectal pain and vomiting.  Genitourinary: Negative for dysuria, hematuria and testicular pain.  Musculoskeletal: Negative for arthralgias and joint swelling.  Skin: Negative for rash.  Neurological: Negative for dizziness, syncope and headaches.  Hematological: Negative for adenopathy.  Psychiatric/Behavioral: Negative for confusion and dysphoric mood.       Objective:   Physical Exam Constitutional:      General: He is not in acute distress.    Appearance: He is well-developed.  HENT:     Head: Normocephalic and atraumatic.     Right Ear: External ear normal.     Left Ear: External ear normal.  Eyes:     Conjunctiva/sclera: Conjunctivae normal.     Pupils: Pupils are equal, round, and reactive to light.  Neck:     Musculoskeletal: Normal range of motion and neck supple.     Thyroid: No thyromegaly.  Cardiovascular:     Rate and Rhythm: Normal rate and regular rhythm.     Heart sounds: Normal heart sounds. No murmur.  Pulmonary:     Effort: No respiratory distress.     Breath sounds: No wheezing or rales.  Abdominal:     General: Bowel sounds  are normal. There is no distension.     Palpations: Abdomen is soft. There is no mass.     Tenderness: There is no abdominal tenderness. There is no guarding or rebound.  Lymphadenopathy:     Cervical: No cervical adenopathy.  Skin:    Findings: No rash.  Neurological:     Mental Status: He is alert and oriented to person, place, and time.     Cranial Nerves: No cranial nerve deficit.     Deep Tendon Reflexes: Reflexes normal.        Assessment:     Physical exam.  Patient has had recent suboptimal control blood pressure but by home readings consistently less than  140/90.  We discussed the following health maintenance issues    Plan:     -Discussed shingles vaccine and he wishes to pursue.  Shingrix given and return 2 months for second Shingrix -Refill valsartan 160 mg 1 daily -Obtain screening labs including PSA -Monitor blood pressure closely at home and be in touch if consistently greater than 140/90  Kristian Covey MD [facility]

## 2018-04-03 NOTE — Patient Instructions (Signed)
Remember to get 2nd Shingrix vaccine in about 2 months.  We will be in touch after labs are back  Sent in prescription for the Diovan 160 mg one daily  Monitor blood pressure and be In touch if consistently > 140/90.

## 2018-04-03 NOTE — Addendum Note (Signed)
Addended by: Solon Augusta on: 04/03/2018 08:36 AM   Modules accepted: Orders

## 2018-04-05 ENCOUNTER — Telehealth: Payer: Self-pay

## 2018-04-05 NOTE — Telephone Encounter (Signed)
-----   Message from Kristian Covey, MD sent at 04/03/2018  2:48 PM EDT ----- Labs all stable.  No real concerns.  Lipids were just slightly up.  Platelets were just barely low at 143K- clinically insignificant.

## 2018-04-05 NOTE — Telephone Encounter (Signed)
Author phoned pt. to relay and review lab results; pt. Verbalized understanding.

## 2018-04-29 ENCOUNTER — Other Ambulatory Visit: Payer: Self-pay | Admitting: Family Medicine

## 2018-04-30 NOTE — Telephone Encounter (Signed)
OK to send 90 day with 1 refill? Not sure if he needed a follow up for this?

## 2018-04-30 NOTE — Telephone Encounter (Signed)
Refill with one additional refill. 

## 2018-06-25 ENCOUNTER — Ambulatory Visit: Payer: 59

## 2018-06-27 ENCOUNTER — Other Ambulatory Visit: Payer: Self-pay

## 2018-06-27 ENCOUNTER — Ambulatory Visit (INDEPENDENT_AMBULATORY_CARE_PROVIDER_SITE_OTHER): Payer: 59 | Admitting: *Deleted

## 2018-06-27 DIAGNOSIS — Z23 Encounter for immunization: Secondary | ICD-10-CM | POA: Diagnosis not present

## 2018-11-05 ENCOUNTER — Other Ambulatory Visit: Payer: Self-pay

## 2018-11-05 DIAGNOSIS — Z20822 Contact with and (suspected) exposure to covid-19: Secondary | ICD-10-CM

## 2018-11-07 LAB — NOVEL CORONAVIRUS, NAA: SARS-CoV-2, NAA: NOT DETECTED

## 2018-11-18 ENCOUNTER — Other Ambulatory Visit: Payer: Self-pay | Admitting: Family Medicine

## 2018-11-24 NOTE — Telephone Encounter (Signed)
Refill for one year 

## 2019-03-17 ENCOUNTER — Other Ambulatory Visit: Payer: Self-pay | Admitting: Family Medicine

## 2019-04-23 ENCOUNTER — Other Ambulatory Visit: Payer: Self-pay

## 2019-04-23 ENCOUNTER — Encounter: Payer: Self-pay | Admitting: Family Medicine

## 2019-04-23 ENCOUNTER — Telehealth: Payer: Self-pay

## 2019-04-23 ENCOUNTER — Ambulatory Visit (INDEPENDENT_AMBULATORY_CARE_PROVIDER_SITE_OTHER): Payer: 59 | Admitting: Family Medicine

## 2019-04-23 VITALS — BP 138/84 | HR 87 | Temp 97.9°F | Wt 236.7 lb

## 2019-04-23 DIAGNOSIS — Z0000 Encounter for general adult medical examination without abnormal findings: Secondary | ICD-10-CM | POA: Diagnosis not present

## 2019-04-23 LAB — BASIC METABOLIC PANEL
BUN: 20 mg/dL (ref 6–23)
CO2: 30 mEq/L (ref 19–32)
Calcium: 9.9 mg/dL (ref 8.4–10.5)
Chloride: 103 mEq/L (ref 96–112)
Creatinine, Ser: 0.96 mg/dL (ref 0.40–1.50)
GFR: 80.11 mL/min (ref >60.00)
Glucose, Bld: 93 mg/dL (ref 70–99)
Potassium: 4.7 mEq/L (ref 3.5–5.1)
Sodium: 140 mEq/L (ref 135–145)

## 2019-04-23 LAB — HEPATIC FUNCTION PANEL
ALT: 29 U/L (ref 0–53)
AST: 29 U/L (ref 0–37)
Albumin: 4.5 g/dL (ref 3.5–5.2)
Alkaline Phosphatase: 64 U/L (ref 39–117)
Bilirubin, Direct: 0.1 mg/dL (ref 0.0–0.3)
Total Bilirubin: 0.8 mg/dL (ref 0.2–1.2)
Total Protein: 7.1 g/dL (ref 6.0–8.3)

## 2019-04-23 LAB — PROTIME-INR
INR: 1.1 ratio — ABNORMAL HIGH (ref 0.8–1.0)
Prothrombin Time: 12.5 s (ref 9.6–13.1)

## 2019-04-23 LAB — CBC WITH DIFFERENTIAL/PLATELET
Basophils Absolute: 0 10*3/uL (ref 0.0–0.1)
Basophils Relative: 0.5 % (ref 0.0–3.0)
Eosinophils Absolute: 0.1 10*3/uL (ref 0.0–0.7)
Eosinophils Relative: 2.8 % (ref 0.0–5.0)
HCT: 45.9 % (ref 39.0–52.0)
Hemoglobin: 15.6 g/dL (ref 13.0–17.0)
Lymphocytes Relative: 36.9 % (ref 12.0–46.0)
Lymphs Abs: 1.7 10*3/uL (ref 0.7–4.0)
MCHC: 34 g/dL (ref 30.0–36.0)
MCV: 97.9 fl (ref 78.0–100.0)
Monocytes Absolute: 0.4 10*3/uL (ref 0.1–1.0)
Monocytes Relative: 9.2 % (ref 3.0–12.0)
Neutro Abs: 2.3 10*3/uL (ref 1.4–7.7)
Neutrophils Relative %: 50.6 % (ref 43.0–77.0)
Platelets: 162 10*3/uL (ref 150.0–400.0)
RBC: 4.69 Mil/uL (ref 4.22–5.81)
RDW: 12.7 % (ref 11.5–15.5)
WBC: 4.5 10*3/uL (ref 4.0–10.5)

## 2019-04-23 LAB — LIPID PANEL
Cholesterol: 222 mg/dL — ABNORMAL HIGH (ref 0–200)
HDL: 56.9 mg/dL (ref >39.00)
LDL Cholesterol: 140 mg/dL — ABNORMAL HIGH (ref 0–99)
NonHDL: 165.13
Total CHOL/HDL Ratio: 4
Triglycerides: 127 mg/dL (ref 0.0–149.0)
VLDL: 25.4 mg/dL (ref 0.0–40.0)

## 2019-04-23 LAB — HEMOGLOBIN A1C: Hgb A1c MFr Bld: 5.3 % (ref 4.6–6.5)

## 2019-04-23 LAB — PSA: PSA: 1.55 ng/mL (ref 0.10–4.00)

## 2019-04-23 LAB — TSH: TSH: 1.72 u[IU]/mL (ref 0.35–4.50)

## 2019-04-23 MED ORDER — LOSARTAN POTASSIUM 100 MG PO TABS: 100.0000 mg | ORAL_TABLET | Freq: Every day | ORAL | 3 refills | Status: DC

## 2019-04-23 NOTE — Telephone Encounter (Signed)
lvm pt needs to schedule a 30 minute appt for surgical clearance form that we received

## 2019-04-23 NOTE — Telephone Encounter (Signed)
Appt. Scheduled.

## 2019-04-23 NOTE — Patient Instructions (Signed)
Preventive Care 41-59 Years Old, Male Preventive care refers to lifestyle choices and visits with your health care provider that can promote health and wellness. This includes:  A yearly physical exam. This is also called an annual well check.  Regular dental and eye exams.  Immunizations.  Screening for certain conditions.  Healthy lifestyle choices, such as eating a healthy diet, getting regular exercise, not using drugs or products that contain nicotine and tobacco, and limiting alcohol use. What can I expect for my preventive care visit? Physical exam Your health care provider will check:  Height and weight. These may be used to calculate body mass index (BMI), which is a measurement that tells if you are at a healthy weight.  Heart rate and blood pressure.  Your skin for abnormal spots. Counseling Your health care provider may ask you questions about:  Alcohol, tobacco, and drug use.  Emotional well-being.  Home and relationship well-being.  Sexual activity.  Eating habits.  Work and work Statistician. What immunizations do I need?  Influenza (flu) vaccine  This is recommended every year. Tetanus, diphtheria, and pertussis (Tdap) vaccine  You may need a Td booster every 10 years. Varicella (chickenpox) vaccine  You may need this vaccine if you have not already been vaccinated. Zoster (shingles) vaccine  You may need this after age 64. Measles, mumps, and rubella (MMR) vaccine  You may need at least one dose of MMR if you were born in 1957 or later. You may also need a second dose. Pneumococcal conjugate (PCV13) vaccine  You may need this if you have certain conditions and were not previously vaccinated. Pneumococcal polysaccharide (PPSV23) vaccine  You may need one or two doses if you smoke cigarettes or if you have certain conditions. Meningococcal conjugate (MenACWY) vaccine  You may need this if you have certain conditions. Hepatitis A  vaccine  You may need this if you have certain conditions or if you travel or work in places where you may be exposed to hepatitis A. Hepatitis B vaccine  You may need this if you have certain conditions or if you travel or work in places where you may be exposed to hepatitis B. Haemophilus influenzae type b (Hib) vaccine  You may need this if you have certain risk factors. Human papillomavirus (HPV) vaccine  If recommended by your health care provider, you may need three doses over 6 months. You may receive vaccines as individual doses or as more than one vaccine together in one shot (combination vaccines). Talk with your health care provider about the risks and benefits of combination vaccines. What tests do I need? Blood tests  Lipid and cholesterol levels. These may be checked every 5 years, or more frequently if you are over 60 years old.  Hepatitis C test.  Hepatitis B test. Screening  Lung cancer screening. You may have this screening every year starting at age 43 if you have a 30-pack-year history of smoking and currently smoke or have quit within the past 15 years.  Prostate cancer screening. Recommendations will vary depending on your family history and other risks.  Colorectal cancer screening. All adults should have this screening starting at age 72 and continuing until age 2. Your health care provider may recommend screening at age 14 if you are at increased risk. You will have tests every 1-10 years, depending on your results and the type of screening test.  Diabetes screening. This is done by checking your blood sugar (glucose) after you have not eaten  for a while (fasting). You may have this done every 1-3 years.  Sexually transmitted disease (STD) testing. Follow these instructions at home: Eating and drinking  Eat a diet that includes fresh fruits and vegetables, whole grains, lean protein, and low-fat dairy products.  Take vitamin and mineral supplements as  recommended by your health care provider.  Do not drink alcohol if your health care provider tells you not to drink.  If you drink alcohol: ? Limit how much you have to 0-2 drinks a day. ? Be aware of how much alcohol is in your drink. In the [place] one drink equals one 12 oz bottle of beer (355 mL), one 5 oz glass of wine (148 mL), or one 1 oz glass of hard liquor (44 mL). Lifestyle  Take daily care of your teeth and gums.  Stay active. Exercise for at least 30 minutes on 5 or more days each week.  Do not use any products that contain nicotine or tobacco, such as cigarettes, e-cigarettes, and chewing tobacco. If you need help quitting, ask your health care provider.  If you are sexually active, practice safe sex. Use a condom or other form of protection to prevent STIs (sexually transmitted infections).  Talk with your health care provider about taking a low-dose aspirin every day starting at age 53. What's next?  Go to your health care provider once a year for a well check visit.  Ask your health care provider how often you should have your eyes and teeth checked.  Stay up to date on all vaccines. This information is not intended to replace advice given to you by your health care provider. Make sure you discuss any questions you have with your health care provider. Document Revised: 12/28/2017 Document Reviewed: 12/28/2017 Elsevier Patient Education  2020 [employer].

## 2019-04-23 NOTE — Progress Notes (Signed)
Subjective:     Patient ID: Michael Bradford, male   DOB: Dec 12, 1960, 59 y.o.   MRN: [id number]  HPI   Michael Bradford is seen for physical exam and preoperative clearance.  He will be getting right total hip replacement.  Has had previous left hip replacement a few years ago.  Generally very healthy.  He has hypertension which is currently treated with valsartan.  This has been well controlled by home readings.  He had been on losartan prior to this and felt like his blood pressure was even better controlled on losartan.  He is due for refills at this time is requesting transition from valsartan to losartan.  No recent chest pains.  No dyspnea.  He had recent Covid vaccine and plans to get second 1 in a few days.  Family history reviewed.  His father had MI at age 9.  No family history of premature CAD.  Patient is non-smoker.  He actually quit back in 2015  He has had previous shingles vaccine (Shingrix).  Tetanus is up-to-date.  Colonoscopy up-to-date.  Previous hepatitis C screening negative.  Past Medical History:  Diagnosis Date  . ALLERGIC RHINITIS   . Anxiety    situational  . Herpes simplex   . Hypertension    eval at age 8  . Recurrent inguinal hernia    asymptomatic   Past Surgical History:  Procedure Laterality Date  . COLONOSCOPY  02/10/2013  . FINGER SURGERY  1970, 1971   times three  . Right eye trauma  1975  . SHOULDER SURGERY  12/21/10   right, ROTATOR CUFF REPAIR  . TOTAL HIP ARTHROPLASTY Left 07/08/2015   Procedure: LEFT TOTAL HIP ARTHROPLASTY ANTERIOR APPROACH;  Surgeon: Ollen Gross, MD;  Location: [facility];  Service: Orthopedics;  Laterality: Left;  Marland Kitchen VASECTOMY  1998  . vocal polyps  1992, 1996   times two  . WISDOM TOOTH EXTRACTION  1976    reports that he quit smoking about 5 years ago. His smoking use included cigarettes. He quit after 10.00 years of use. He has never used smokeless tobacco. He reports current alcohol use of about 10.0 standard drinks of alcohol  per week. He reports that he does not use drugs. family history includes Arthritis in his mother; Colon polyps (age of onset: 82) in his father; Diabetes in his mother; Heart disease (age of onset: 44) in his father; Hyperlipidemia in his brother; Hypertension in his brother and father; Obesity in his father; Thyroid disease in his sister. No Known Allergies   Review of Systems  Constitutional: Negative for activity change, appetite change, fatigue and fever.  HENT: Negative for congestion, ear pain and trouble swallowing.   Eyes: Negative for pain and visual disturbance.  Respiratory: Negative for cough, shortness of breath and wheezing.   Cardiovascular: Negative for chest pain and palpitations.  Gastrointestinal: Negative for abdominal distention, abdominal pain, blood in stool, constipation, diarrhea, nausea, rectal pain and vomiting.  Genitourinary: Negative for dysuria, hematuria and testicular pain.  Musculoskeletal: Positive for arthralgias. Negative for joint swelling.  Skin: Negative for rash.  Neurological: Negative for dizziness, syncope and headaches.  Hematological: Negative for adenopathy.  Psychiatric/Behavioral: Negative for confusion and dysphoric mood.       Objective:   Physical Exam Vitals reviewed.  Constitutional:      General: He is not in acute distress.    Appearance: He is well-developed.  HENT:     Head: Normocephalic and atraumatic.     Right Ear:  External ear normal.     Left Ear: External ear normal.  Eyes:     Conjunctiva/sclera: Conjunctivae normal.     Pupils: Pupils are equal, round, and reactive to light.  Neck:     Thyroid: No thyromegaly.  Cardiovascular:     Rate and Rhythm: Normal rate and regular rhythm.     Heart sounds: Normal heart sounds. No murmur.  Pulmonary:     Effort: No respiratory distress.     Breath sounds: No wheezing or rales.  Abdominal:     General: Bowel sounds are normal. There is no distension.     Palpations:  Abdomen is soft. There is no mass.     Tenderness: There is no abdominal tenderness. There is no guarding or rebound.  Musculoskeletal:     Cervical back: Normal range of motion and neck supple.     Right lower leg: No edema.     Left lower leg: No edema.  Lymphadenopathy:     Cervical: No cervical adenopathy.  Skin:    Findings: No rash.  Neurological:     Mental Status: He is alert and oriented to person, place, and time.     Cranial Nerves: No cranial nerve deficit.     Deep Tendon Reflexes: Reflexes normal.        Assessment:     Physical exam.  He has slightly elevated blood pressure here today but has had consistently well controlled blood pressures at home.  Overall low risk for surgery.    Plan:     -Obtain follow-up EKG. this shows normal sinus rhythm with no acute ST-T changes -We will obtain follow-up screening labs and include PSA along with lipids and chemistries -In process of getting Covid vaccination as above.  Other immunizations up-to-date  Eulas Post MD [facility]

## 2019-05-29 ENCOUNTER — Telehealth: Payer: Self-pay | Admitting: Family Medicine

## 2019-05-29 MED ORDER — ACYCLOVIR 200 MG PO CAPS: ORAL_CAPSULE | ORAL | 1 refills | Status: DC

## 2019-05-29 NOTE — Addendum Note (Signed)
Addended by: Raiford Simmonds R on: 05/29/2019 12:59 PM   Modules accepted: Orders

## 2019-05-29 NOTE — Telephone Encounter (Signed)
Okay to refill? 

## 2019-05-29 NOTE — Telephone Encounter (Signed)
Patient needs a refill on acyclovir (ZOVIRAX) 200 MG capsule and would like a refill with that Rx  [id number] - [place], [place] - [address] AT [facility] [address] Phone:  [phone]  Fax:  [phone]

## 2019-05-29 NOTE — Telephone Encounter (Signed)
Please advise if okay havent been filled since 2017

## 2019-05-29 NOTE — Telephone Encounter (Signed)
Refill sent in

## 2019-11-12 ENCOUNTER — Other Ambulatory Visit: Payer: Self-pay | Admitting: Family Medicine

## 2019-11-12 MED ORDER — LOSARTAN POTASSIUM 100 MG PO TABS: 100.0000 mg | ORAL_TABLET | Freq: Every day | ORAL | 3 refills | Status: DC

## 2019-11-12 MED ORDER — SERTRALINE HCL 50 MG PO TABS: 50.0000 mg | ORAL_TABLET | Freq: Every day | ORAL | 3 refills | Status: DC

## 2019-11-12 NOTE — Addendum Note (Signed)
Addended by: Weyman Croon E on: 11/12/2019 04:10 PM   Modules accepted: Orders

## 2019-11-12 NOTE — Telephone Encounter (Signed)
Rx's sent in to last pt until next physical next year.

## 2019-11-12 NOTE — Telephone Encounter (Signed)
Patient's job is ending this Friday due to COVID and he would like for his 2 Rx to be refilled for has long as you can refill them for to hold him over while he is without a job.  losartan (COZAAR) 100 MG tablet  sertraline (ZOLOFT) 50 MG tablet  [id number] - [place], [place] - [address] AT [facility] [address] Phone:  [phone]  Fax:  [phone]

## 2019-12-09 ENCOUNTER — Other Ambulatory Visit: Payer: Self-pay | Admitting: Family Medicine

## 2020-05-13 ENCOUNTER — Other Ambulatory Visit: Payer: Self-pay | Admitting: Family Medicine

## 2020-06-04 ENCOUNTER — Ambulatory Visit: Payer: No Typology Code available for payment source | Admitting: Family Medicine

## 2020-06-04 ENCOUNTER — Encounter: Payer: Self-pay | Admitting: Family Medicine

## 2020-06-04 ENCOUNTER — Other Ambulatory Visit: Payer: Self-pay

## 2020-06-04 VITALS — BP 152/100 | HR 74 | Temp 98.3°F | Wt 225.6 lb

## 2020-06-04 DIAGNOSIS — I10 Essential (primary) hypertension: Secondary | ICD-10-CM | POA: Diagnosis not present

## 2020-06-04 MED ORDER — LOSARTAN POTASSIUM-HCTZ 100-25 MG PO TABS: 1.0000 | ORAL_TABLET | Freq: Every day | ORAL | 2 refills | Status: DC

## 2020-06-04 NOTE — Progress Notes (Signed)
   Subjective:    Patient ID: Michael Bradford, male    DOB: 06/22/1960, 60 y.o.   MRN: [id number]  HPI Here for elevated BP. He has been on Losartan 100 mg for several years, and his BP has always been stable in the 120s or 130s over 80s. However his pharmacy recently switched to a different generic manufacturer (he says the pills are a different color), and his BP immediately went up. He feels fine. He is working out and has actually lost 10 lbs in the past few months.    Review of Systems  Constitutional: Negative.   Respiratory: Negative.   Cardiovascular: Negative.        Objective:   Physical Exam Constitutional:      Appearance: Normal appearance.  Cardiovascular:     Rate and Rhythm: Normal rate and regular rhythm.     Pulses: Normal pulses.     Heart sounds: Normal heart sounds.  Pulmonary:     Effort: Pulmonary effort is normal.     Breath sounds: Normal breath sounds.  Musculoskeletal:     Right lower leg: No edema.     Left lower leg: No edema.  Neurological:     Mental Status: He is alert.           Assessment & Plan:  HTN. We will switch him to Losartan HCT 100-25 daily. He will follow up with Dr. Caryl Never in a few weeks.  Gershon Crane, MD

## 2020-08-18 ENCOUNTER — Other Ambulatory Visit: Payer: Self-pay

## 2020-08-19 ENCOUNTER — Ambulatory Visit: Payer: No Typology Code available for payment source | Admitting: Family Medicine

## 2020-08-19 ENCOUNTER — Encounter: Payer: Self-pay | Admitting: Family Medicine

## 2020-08-19 VITALS — BP 140/78 | HR 75 | Temp 97.8°F | Wt 228.7 lb

## 2020-08-19 DIAGNOSIS — G8929 Other chronic pain: Secondary | ICD-10-CM

## 2020-08-19 DIAGNOSIS — I10 Essential (primary) hypertension: Secondary | ICD-10-CM | POA: Diagnosis not present

## 2020-08-19 DIAGNOSIS — M545 Low back pain, unspecified: Secondary | ICD-10-CM | POA: Diagnosis not present

## 2020-08-19 DIAGNOSIS — L821 Other seborrheic keratosis: Secondary | ICD-10-CM | POA: Diagnosis not present

## 2020-08-19 MED ORDER — AMLODIPINE BESYLATE 2.5 MG PO TABS: 2.5000 mg | ORAL_TABLET | Freq: Every day | ORAL | 2 refills | Status: DC

## 2020-08-19 NOTE — Patient Instructions (Signed)
Set up complete physical soon.   

## 2020-08-19 NOTE — Progress Notes (Signed)
Established Patient Office Visit  Subjective:  Patient ID: Michael Bradford, male    DOB: Jun 20, 1960  Age: 60 y.o. MRN: [id number]  CC:  Chief Complaint  Patient presents with   Hypertension    BP medication was changed, BP started to climb, saw Dr. Clent Ridges and meds were changed back to losartan Hctz, BP did go down with the change in meds but still not where it normally was before the changes.    HPI Michael Bradford presents for the following issues  Hypertension.  Had been on plain losartan 100 mg daily.  He was seen in my absence back in May and switched to losartan HCTZ.  Blood pressure has improved some from systolics around 160 to currently around 140.  Does eat out frequently and realizes he may be consuming excessive sodium.  Also drinks scotch most days usually 3 to 4 ounces.  No headaches or dizziness.  No chest pains.  He has some chronic intermittent low back pain.  He would like to see sports medicine regarding this.  No radiculitis symptoms.  Has tried some stretches without improvement.  No significant lower extremity numbness or weakness.  Scaly brownish lesion left forearm.  He would like to have this evaluated.  No past history of skin cancer.  Past Medical History:  Diagnosis Date   ALLERGIC RHINITIS    Anxiety    situational   Herpes simplex    Hypertension    eval at age 57   Recurrent inguinal hernia    asymptomatic    Past Surgical History:  Procedure Laterality Date   COLONOSCOPY  02/10/2013   FINGER SURGERY  1970, 1971   times three   Right eye trauma  1975   SHOULDER SURGERY  12/21/10   right, ROTATOR CUFF REPAIR   TOTAL HIP ARTHROPLASTY Left 07/08/2015   Procedure: LEFT TOTAL HIP ARTHROPLASTY ANTERIOR APPROACH;  Surgeon: Ollen Gross, MD;  Location: [facility];  Service: Orthopedics;  Laterality: Left;   VASECTOMY  1998   vocal polyps  1992, 1996   times two   WISDOM TOOTH EXTRACTION  1976    Family History  Problem Relation Age of Onset    Arthritis Mother    Diabetes Mother    Hypertension Father    Obesity Father    Heart disease Father 56       MI   Colon polyps Father 73   Thyroid disease Sister    Hyperlipidemia Brother    Hypertension Brother    Colon cancer Neg Hx     Social History   Socioeconomic History   Marital status: Divorced    Spouse name: Not on file   Number of children: Not on file   Years of education: Not on file   Highest education level: Not on file  Occupational History    Employer: [employer]  Tobacco Use   Smoking status: Former    Years: 10.00    Types: Cigarettes    Quit date: 01/16/2014    Years since quitting: 6.5   Smokeless tobacco: Never  Substance and Sexual Activity   Alcohol use: Yes    Alcohol/week: 10.0 standard drinks    Types: 10 Glasses of wine per week    Comment: 2 GLASSES OF WINE PER DAY   Drug use: No   Sexual activity: Not on file  Other Topics Concern   Not on file  Social History Narrative   The patient is divorced, 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      2 glasses of wine daily, former smoker, no substance abuse reported   Social Determinants of Corporate investment banker Strain: Not on file  Food Insecurity: Not on file  Transportation Needs: Not on file  Physical Activity: Not on file  Stress: Not on file  Social Connections: Not on file  Intimate Partner Violence: Not on file    Outpatient Medications Prior to Visit  Medication Sig Dispense Refill   acyclovir (ZOVIRAX) 200 MG capsule TAKE 1 CAPSULE (200 MG TOTAL) BY MOUTH DAILY AS NEEDED. 30 capsule 5   albuterol (PROVENTIL HFA;VENTOLIN HFA) 108 (90 Base) MCG/ACT inhaler Inhale 1-2 puffs into the lungs as needed for wheezing.  0   losartan-hydrochlorothiazide (HYZAAR) 100-25 MG tablet Take 1 tablet by mouth daily. 30 tablet 2   meloxicam (MOBIC) 15 MG tablet Take 15 mg by mouth daily.     sertraline (ZOLOFT) 50  MG tablet Take 1 tablet (50 mg total) by mouth daily. 90 tablet 3   tetrahydrozoline 0.05 % ophthalmic solution Place 1 drop into both eyes 4 (four) times daily as needed (For eye irritation.).     No facility-administered medications prior to visit.    No Known Allergies  ROS Review of Systems  Constitutional:  Negative for fatigue and unexpected weight change.  Eyes:  Negative for visual disturbance.  Respiratory:  Negative for cough, chest tightness and shortness of breath.   Cardiovascular:  Negative for chest pain, palpitations and leg swelling.  Musculoskeletal:  Positive for back pain.  Neurological:  Negative for dizziness, syncope, weakness, light-headedness and headaches.     Objective:    Physical Exam Vitals reviewed.  Constitutional:      Appearance: Normal appearance.  Cardiovascular:     Rate and Rhythm: Normal rate and regular rhythm.  Pulmonary:     Effort: Pulmonary effort is normal.     Breath sounds: Normal breath sounds.  Musculoskeletal:     Right lower leg: No edema.     Left lower leg: No edema.  Skin:    Comments: Approximately 3 to 4 mm well demarcated brown scaly skin lesion left forearm  Neurological:     Mental Status: He is alert.    BP 140/78 (BP Location: Left Arm, Patient Position: Sitting, Cuff Size: Normal)   Pulse 75   Temp 97.8 F (36.6 C) (Oral)   Wt 228 lb 11.2 oz (103.7 kg)   SpO2 98%   BMI 31.02 kg/m  Wt Readings from Last 3 Encounters:  08/19/20 228 lb 11.2 oz (103.7 kg)  06/04/20 225 lb 9.6 oz (102.3 kg)  04/23/19 236 lb 11.2 oz (107.4 kg)     Health Maintenance Due  Topic Date Due   COVID-19 Vaccine (1) Never done   INFLUENZA VACCINE  08/17/2020    There are no preventive care reminders to display for this patient.  Lab Results  Component Value Date   TSH 1.72 04/23/2019   Lab Results  Component Value Date   WBC 4.5 04/23/2019   HGB 15.6 04/23/2019   HCT 45.9 04/23/2019   MCV 97.9 04/23/2019   PLT 162.0  04/23/2019   Lab Results  Component Value Date   NA 140 04/23/2019   K 4.7 04/23/2019   CO2 30 04/23/2019   GLUCOSE 93 04/23/2019   BUN 20 04/23/2019   CREATININE 0.96 04/23/2019   BILITOT  0.8 04/23/2019   ALKPHOS 64 04/23/2019   AST 29 04/23/2019   ALT 29 04/23/2019   PROT 7.1 04/23/2019   ALBUMIN 4.5 04/23/2019   CALCIUM 9.9 04/23/2019   ANIONGAP 6 07/09/2015   GFR 80.11 04/23/2019   Lab Results  Component Value Date   CHOL 222 (H) 04/23/2019   Lab Results  Component Value Date   HDL 56.90 04/23/2019   Lab Results  Component Value Date   LDLCALC 140 (H) 04/23/2019   Lab Results  Component Value Date   TRIG 127.0 04/23/2019   Lab Results  Component Value Date   CHOLHDL 4 04/23/2019   Lab Results  Component Value Date   HGBA1C 5.3 04/23/2019      Assessment & Plan:   #1 hypertension.  Improved some after change from plain losartan to losartan HCTZ but still suboptimally controlled. -Discussed nonpharmacologic management with weight control, regular exercise, keep sodium intake less than 2500 mg daily, and watch alcohol consumption. -Add amlodipine 2.5 mg once daily and bring back to reassess blood pressure soon.  He will schedule complete physical.  Obtain lab work and then  #2 benign-appearing seborrheic keratosis left forearm Reassurance given  #3 chronic intermittent low back pain.  Patient would like to consult with sports medicine.  We will set this up.   Meds ordered this encounter  Medications   amLODipine (NORVASC) 2.5 MG tablet    Sig: Take 1 tablet (2.5 mg total) by mouth daily.    Dispense:  30 tablet    Refill:  2    Follow-up: No follow-ups on file.    Evelena Peat, MD

## 2020-08-25 ENCOUNTER — Other Ambulatory Visit: Payer: Self-pay | Admitting: Family Medicine

## 2020-08-28 NOTE — Progress Notes (Signed)
Erroneous encounter

## 2020-08-31 ENCOUNTER — Encounter: Payer: Self-pay | Admitting: Family Medicine

## 2020-08-31 ENCOUNTER — Ambulatory Visit: Payer: No Typology Code available for payment source | Admitting: Family Medicine

## 2020-08-31 ENCOUNTER — Other Ambulatory Visit: Payer: Self-pay

## 2020-08-31 NOTE — Progress Notes (Signed)
Subjective:    I'm seeing this patient as a consultation for:  Dr. Caryl Never. Note will be routed back to referring provider/PCP.  CC: Low back pain  I, Molly Weber, LAT, ATC, am serving as scribe for Dr. Clementeen Graham.  HPI: Pt is a 60 y/o male presenting w/ c/o chronic low back pain x 6 months w/ no known MOI.  He locates his pain to his lower back and SIJ.  He has had 2 THRs over the last 4 years.  Pain is worse with standing and extension and better with sitting and flexion.   Radiating pain: No LE numbness/tingling: No Aggravating factors: laying supine; progressively worsens throughout the day Treatments tried: stretching; Advil; Aspercreme  Past medical history, Surgical history, Family history, Social history, Allergies, and medications have been entered into the medical record, reviewed. Patient works as a Special educational needs teacher.  Review of Systems: No new headache, visual changes, nausea, vomiting, diarrhea, constipation, dizziness, abdominal pain, skin rash, fevers, chills, night sweats, weight loss, swollen lymph nodes, body aches, joint swelling, muscle aches, chest pain, shortness of breath, mood changes, visual or auditory hallucinations.   Objective:    Vitals:   09/01/20 0844  BP: 120/80  Pulse: 74  SpO2: 96%   General: Well Developed, well nourished, and in no acute distress.  Neuro/Psych: Alert and oriented x3, extra-ocular muscles intact, able to move all 4 extremities, sensation grossly intact. Skin: Warm and dry, no rashes noted.  Respiratory: Not using accessory muscles, speaking in full sentences, trachea midline.  Cardiovascular: Pulses palpable, no extremity edema. Abdomen: Does not appear distended. MSK: L-spine nontender midline.  Nontender paraspinal musculature. Decreased lumbar motion to extension.  Normal flexion rotation and lateral flexion. Lower extremity strength is intact. Normal gait.  Lab and Radiology Results  X-ray images L-spine  obtained today personally and independently interpreted. Significant DDD L5-S1 with facet DJD L5-S1 L4-L5.  No acute fractures or significant malformation or malalignment. Await formal radiology review  Impression and Recommendations:    Assessment and Plan: 60 y.o. male with central to bilateral low back pain ongoing for about 6 months.  Pain is worse with extension and better with flexion.  This is a pattern that is typical for spinal stenosis or spondylolisthesis.  Based on x-ray appearance spinal stenosis or facet DJD is most likely cause of pain as his core weakness.  Plan for physical therapy to focus on core strengthening and stabilization..  Recheck in about 6 weeks.  If not improved would consider MRI to further characterize cause of pain and for potential injection planning.  PDMP not reviewed this encounter. Orders Placed This Encounter  Procedures   DG Lumbar Spine 2-3 Views    Standing Status:   Future    Number of Occurrences:   1    Standing Expiration Date:   10/02/2020    Order Specific Question:   Reason for Exam (SYMPTOM  OR DIAGNOSIS REQUIRED)    Answer:   low back pain    Order Specific Question:   Preferred imaging location?    Answer:   [facility]   Ambulatory referral to Physical Therapy    Referral Priority:   Routine    Referral Type:   Physical Medicine    Referral Reason:   Specialty Services Required    Requested Specialty:   Physical Therapy    Number of Visits Requested:   1   No orders of the defined types were placed in this encounter.  Discussed warning signs or symptoms. Please see discharge instructions. Patient expresses understanding.   The above documentation has been reviewed and is accurate and complete Lynne Leader, M.D.

## 2020-09-01 ENCOUNTER — Ambulatory Visit (INDEPENDENT_AMBULATORY_CARE_PROVIDER_SITE_OTHER): Payer: No Typology Code available for payment source | Admitting: Family Medicine

## 2020-09-01 ENCOUNTER — Encounter: Payer: Self-pay | Admitting: Family Medicine

## 2020-09-01 ENCOUNTER — Ambulatory Visit (INDEPENDENT_AMBULATORY_CARE_PROVIDER_SITE_OTHER): Payer: No Typology Code available for payment source

## 2020-09-01 ENCOUNTER — Other Ambulatory Visit: Payer: Self-pay

## 2020-09-01 VITALS — BP 120/80 | HR 74 | Ht 72.0 in | Wt 225.2 lb

## 2020-09-01 DIAGNOSIS — M545 Low back pain, unspecified: Secondary | ICD-10-CM | POA: Diagnosis not present

## 2020-09-01 DIAGNOSIS — G8929 Other chronic pain: Secondary | ICD-10-CM

## 2020-09-01 NOTE — Patient Instructions (Signed)
Thank you for coming in today.   Please get an Xray today before you leave   I've referred you to Physical Therapy.  Let us know if you don't hear from them in one week.   Recheck in 6 weeks.   Let me know if you have a problem.   

## 2020-09-02 NOTE — Progress Notes (Signed)
Lumbar spine x-ray shows some shifting of some of the vertebrae in the spine which certainly could cause a lot of back pain with standing up.  Additionally there is arthritis in the back.  Plan for trial of physical therapy and if not better MRI.

## 2020-09-10 ENCOUNTER — Encounter: Payer: Self-pay | Admitting: Physical Therapy

## 2020-09-10 ENCOUNTER — Ambulatory Visit: Payer: No Typology Code available for payment source | Admitting: Physical Therapy

## 2020-09-10 ENCOUNTER — Other Ambulatory Visit: Payer: Self-pay

## 2020-09-10 DIAGNOSIS — G8929 Other chronic pain: Secondary | ICD-10-CM | POA: Diagnosis not present

## 2020-09-10 DIAGNOSIS — M545 Low back pain, unspecified: Secondary | ICD-10-CM

## 2020-09-10 DIAGNOSIS — M6281 Muscle weakness (generalized): Secondary | ICD-10-CM | POA: Diagnosis not present

## 2020-09-10 NOTE — Therapy (Signed)
[facility] [address] [place], [place], [address] Phone: [phone]   Fax:  [phone]  Physical Therapy Evaluation  Patient Details  Name: Michael Bradford MRN: [id number] Date of Birth: 12/23/1960 Referring Provider (PT): Clementeen Graham   Encounter Date: 09/10/2020   PT End of Session - 09/10/20 0809     Visit Number 1    Number of Visits 6    Date for PT Re-Evaluation 10/22/20    Authorization Type UHC    PT Start Time 0809    PT Stop Time 0844    PT Time Calculation (min) 35 min    Activity Tolerance Patient tolerated treatment well    Behavior During Therapy [facility] for tasks assessed/performed             Past Medical History:  Diagnosis Date   ALLERGIC RHINITIS    Anxiety    situational   Herpes simplex    Hypertension    eval at age 59   Recurrent inguinal hernia    asymptomatic    Past Surgical History:  Procedure Laterality Date   COLONOSCOPY  02/10/2013   FINGER SURGERY  1970, 1971   times three   Right eye trauma  1975   SHOULDER SURGERY  12/21/10   right, ROTATOR CUFF REPAIR   TOTAL HIP ARTHROPLASTY Left 07/08/2015   Procedure: LEFT TOTAL HIP ARTHROPLASTY ANTERIOR APPROACH;  Surgeon: Ollen Gross, MD;  Location: [facility];  Service: Orthopedics;  Laterality: Left;   VASECTOMY  1998   vocal polyps  1992, 1996   times two   WISDOM TOOTH EXTRACTION  1976    There were no vitals filed for this visit.    Subjective Assessment - 09/10/20 0812     Subjective Doing contracting the past year and at night could feel tiredness at night. Have to sleep on side, can't sleep on back. Michael Bradford of a pain that just gets worse with prolonged activity. lying down 5-15 min causes pain.    Pertinent History HTN, anxiety, bil hip replacements (ant), Rt RCR    How long can you sit comfortably? 2 hours    How long can you stand comfortably? 2 hours    Diagnostic tests xrays -    Patient Stated Goals decrease pain and sleep better     Currently in Pain? No/denies    Pain Location Back    Pain Orientation Lower    Pain Descriptors / Indicators Aching    Pain Onset More than a month ago    Pain Frequency Intermittent                [facility] PT Assessment - 09/10/20 0001       Assessment   Medical Diagnosis chronic LBP without sciatica    Referring Provider (PT) Clementeen Graham    Onset Date/Surgical Date 03/13/20    Hand Dominance Right    Next MD Visit mid september    Prior Therapy no      Precautions   Precautions None      Restrictions   Weight Bearing Restrictions No      Balance Screen   Has the patient fallen in the past 6 months No    Has the patient had a decrease in activity level because of a fear of falling?  No    Is the patient reluctant to leave their home because of a fear of falling?  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    Leisure limited 2-3 miles walking; feels better when playing tennis      Observation/Other Assessments   Focus on Therapeutic Outcomes (FOTO)  69 (goal 76)      Posture/Postural Control   Posture Comments depressed right shoulder and scapula      ROM / Strength   AROM / PROM / Strength AROM;Strength      AROM   Overall AROM Comments Lumbar: LSB 12 deg RSB 7 deg full flex/ext and Left rot; Rt rot 90%      Strength   Overall Strength Comments BLE 5/5 except HS 4+/5 and hip ext 4+/5      Flexibility   Soft Tissue Assessment /Muscle Length yes    Quadriceps bil quad tightness    Piriformis right tight    Quadratus Lumborum right tight      Palpation   Palpation comment unremarkable                        Objective measurements completed on examination: See above findings.       [facility] Adult PT Treatment/Exercise -  09/10/20 0001       Exercises   Exercises Lumbar      Lumbar Exercises: Supine   Ab Set 10 reps;5 seconds    AB Set Limitations TC and VC to contract TA vs. obliques; also did some bent knee fall outs and marches to show progression; Worked on TA in sitting also                    PT Education - 09/10/20 0856     Education Details Ed on spondylolisthesis and spondylolysis; importance of neutral spine; HEP    Person(s) Educated Patient    Methods Explanation;Demonstration;Handout    Comprehension Verbalized understanding;Returned demonstration                 PT Long Term Goals - 09/10/20 0857       PT LONG TERM GOAL #1   Title Ind with HEP to maintain strong core    Time 6    Period Weeks    Status New    Target Date 10/22/20      PT LONG TERM GOAL #2   Title Pt able to sleep without waking from back pain.    Time 6    Period Weeks    Status New      PT LONG TERM GOAL #3   Title Decreased pain with sitting, standing and walking by 75% or more.    Time 6    Period Weeks    Status New      PT LONG TERM GOAL #4   Title Pt able to demonstrate correct form when lifting, carrying and squatting >= 50# to protect back with work activities.    Time 6    Period Weeks    Status New      PT LONG TERM GOAL #5   Title Improved FOTO FS score to >=76    Baseline 69    Time 6    Period Weeks    Status New  Plan - 09/10/20 0845     Clinical Impression Statement Patient presents with c/o of LBP x 6 months. Pain is a dull ache that comes on gradually with prolonged sitting, standing and walking. He also is unable to sleep on his back due to pain. Xrays show gd II anterolisthesis of L5 on S1 with suspected spondylolysis. He has full ROM except some tightness with bil SB and right rotation. He has a tight right QL and mild tightness in his right hip. He has had bil THA in past 5 years. 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 He will benefit from skilled PT to address these defiicits and to strengthen his core, lumbar and LEs to help maintain a neutral spine with functional activities.    Personal Factors and Comorbidities Comorbidity 2    Comorbidities bil THA, possible spondylolysis    Examination-Activity Limitations Sleep    Stability/Clinical Decision Making Stable/Uncomplicated    Clinical Decision Making Low    Rehab Potential Excellent    PT Frequency 1x / week    PT Duration 6 weeks    PT Treatment/Interventions ADLs/Self Care Home Management;Cryotherapy;Electrical Stimulation;Moist Heat;Therapeutic exercise;Therapeutic activities;Patient/family education;Manual techniques;Dry needling    PT Next Visit Plan progress TA and core strength including planks/side planks; assess squats and lifting techniques (see goal); progress HEP    PT Home Exercise Plan [email or web address]    Consulted and Agree with Plan of Care Patient             Patient will benefit from skilled therapeutic intervention in order to improve the following deficits and impairments:  Increased muscle spasms, Pain, Postural dysfunction, Decreased strength, Impaired flexibility  Visit Diagnosis: Chronic bilateral low back pain without sciatica  Muscle weakness (generalized)     Problem List Patient Active Problem List   Diagnosis Date Noted   Prolapsed internal hemorrhoids, grade 2, bleeding 08/23/2017   OA (osteoarthritis) of hip 07/08/2015   Osteoarthritis of left hip 05/20/2015   Obesity (BMI 30-39.9) 06/13/2013   HIP PAIN, RIGHT 03/20/2009   SHOULDER PAIN, RIGHT 12/05/2008   ERECTILE DYSFUNCTION, MILD 12/18/2007   SORE THROAT 12/18/2007   ADJUSTMENT DISORDER WITH ANXIETY 03/08/2007   Essential hypertension 09/28/2006   ALLERGIC RHINITIS 09/28/2006   INGUINAL HERNIA, RIGHT 09/28/2006   Solon Palm PT 09/10/2020, 9:15 AM  [facility] Physical Therapy [address] [place], [place],  [address] Phone: [phone]   Fax:  [phone]  Name: Michael Bradford MRN: [id number] Date of Birth: 06-Nov-1960

## 2020-09-10 NOTE — Patient Instructions (Signed)
Access Code: [email or web address] URL: [email or web address] Date: 09/10/2020 Prepared by: Raynelle Fanning  Exercises Supine Bridge - 2 x daily - 7 x weekly - 1-3 sets - 10 reps Hooklying Transversus Abdominis Palpation - 2 x daily - 7 x weekly - 2 sets - 10 reps - 5 sec hold Seated Transversus Abdominis Bracing - 2 x daily - 7 x weekly - 2 sets - 10 reps - 5 sec hold Standing Transverse Abdominis Contraction - 3 x daily - 7 x weekly - 2 sets - 10 reps - 5 sec hold

## 2020-09-29 ENCOUNTER — Encounter: Payer: No Typology Code available for payment source | Admitting: Physical Therapy

## 2020-10-01 ENCOUNTER — Encounter: Payer: No Typology Code available for payment source | Admitting: Rehabilitative and Restorative Service Providers"

## 2020-10-07 ENCOUNTER — Encounter: Payer: No Typology Code available for payment source | Admitting: Physical Therapy

## 2020-10-07 ENCOUNTER — Telehealth: Payer: Self-pay | Admitting: Physical Therapy

## 2020-10-07 NOTE — Telephone Encounter (Signed)
Pt did not show for PT appt, and reports he had called last week to cx this week as his daughter is getting married.  Reminded of next scheduled PT appt.  Clarita Crane, PT, DPT 10/07/20 8:20 AM

## 2020-10-12 NOTE — Progress Notes (Deleted)
   I, Michael Bradford, LAT, ATC, am serving as scribe for Dr. Clementeen Graham.  Michael Bradford is a 60 y.o. male who presents to [facility] at [facility] today for f/u of chronic low back pain likely due to DJD and/or spinal stenosis.  He was last seen by Dr. Denyse Amass on 09/01/20 and was referred to PT of which he's completed one session.  Today, pt reports   Diagnostic imaging: L-spine XR- 09/01/20  Pertinent review of systems: ***  Relevant historical information: ***   Exam:  There were no vitals taken for this visit. General: Well Developed, well nourished, and in no acute distress.   MSK: ***    Lab and Radiology Results No results found for this or any previous visit (from the past 72 hour(s)). No results found.     Assessment and Plan: 59 y.o. male with ***   PDMP not reviewed this encounter. No orders of the defined types were placed in this encounter.  No orders of the defined types were placed in this encounter.    Discussed warning signs or symptoms. Please see discharge instructions. Patient expresses understanding.   ***

## 2020-10-13 ENCOUNTER — Ambulatory Visit: Payer: No Typology Code available for payment source | Admitting: Family Medicine

## 2020-10-14 ENCOUNTER — Encounter: Payer: Self-pay | Admitting: Physical Therapy

## 2020-10-14 ENCOUNTER — Other Ambulatory Visit: Payer: Self-pay

## 2020-10-14 ENCOUNTER — Ambulatory Visit (INDEPENDENT_AMBULATORY_CARE_PROVIDER_SITE_OTHER): Payer: No Typology Code available for payment source | Admitting: Physical Therapy

## 2020-10-14 DIAGNOSIS — M545 Low back pain, unspecified: Secondary | ICD-10-CM | POA: Diagnosis not present

## 2020-10-14 DIAGNOSIS — M6281 Muscle weakness (generalized): Secondary | ICD-10-CM

## 2020-10-14 DIAGNOSIS — G8929 Other chronic pain: Secondary | ICD-10-CM | POA: Diagnosis not present

## 2020-10-14 NOTE — Patient Instructions (Signed)
Access Code: [email or web address] URL: [email or web address] Date: 10/14/2020 Prepared by: Moshe Cipro  Exercises Supine Bridge - 2 x daily - 7 x weekly - 1-3 sets - 10 reps Hooklying Transversus Abdominis Palpation - 2 x daily - 7 x weekly - 2 sets - 10 reps - 5 sec hold Seated Transversus Abdominis Bracing - 2 x daily - 7 x weekly - 2 sets - 10 reps - 5 sec hold Standing Transverse Abdominis Contraction - 3 x daily - 7 x weekly - 2 sets - 10 reps - 5 sec hold Plank on Forearms with Scapular Protraction Retraction AROM - 1 x daily - 7 x weekly - 1-2 sets - 2-3 reps - 60 sec hold Squat - 1 x daily - 7 x weekly - 3 sets - 10 reps Kettlebell Deadlift - 1 x daily - 7 x weekly - 3 sets - 10 reps  Patient Education Trigger Point Dry Needling

## 2020-10-14 NOTE — Therapy (Addendum)
[facility] Physical Therapy [address] [place], [place], [address] Phone: [phone]   Fax:  [phone]  Physical Therapy Treatment/Discharge Summary  Patient Details  Name: Michael Bradford MRN: [id number] Date of Birth: 1960/11/18 Referring Provider (PT): Lynne Leader   Encounter Date: 10/14/2020   PT End of Session - 10/14/20 0842     Visit Number 2    Number of Visits 6    Date for PT Re-Evaluation 10/22/20    Authorization Type UHC    PT Start Time 0804    PT Stop Time 0842    PT Time Calculation (min) 38 min    Activity Tolerance Patient tolerated treatment well    Behavior During Therapy [facility] for tasks assessed/performed             Past Medical History:  Diagnosis Date   ALLERGIC RHINITIS    Anxiety    situational   Herpes simplex    Hypertension    eval at age 79   Recurrent inguinal hernia    asymptomatic    Past Surgical History:  Procedure Laterality Date   COLONOSCOPY  02/10/2013   [place]   times three   Right eye trauma  [place]  12/21/10   right, ROTATOR CUFF REPAIR   TOTAL HIP ARTHROPLASTY Left 07/08/2015   Procedure: LEFT TOTAL HIP ARTHROPLASTY ANTERIOR APPROACH;  Surgeon: Gaynelle Arabian, MD;  Location: [facility];  Service: Orthopedics;  Laterality: Left;   VASECTOMY  1998   vocal polyps  1992, 1996   times two   [place]    There were no vitals filed for this visit.   Subjective Assessment - 10/14/20 0807     Subjective throbbing pain has improved, feels 20% improved.  played tennis last night without difficulty    Pertinent History HTN, anxiety, bil hip replacements (ant), Rt RCR    How long can you sit comfortably? 2 hours    How long can you stand comfortably? 2 hours    Diagnostic tests xrays -    Patient Stated Goals decrease pain and sleep better    Currently in Pain? No/denies    Pain Location Back    Pain Descriptors / Indicators Throbbing;Sore     Pain Onset More than a month ago                               [facility] Adult PT Treatment/Exercise - 10/14/20 0809       Lumbar Exercises: Stretches   Prone on Elbows Stretch 3 reps;60 seconds   continuous   Prone on Elbows Stretch Limitations no significant change in symptoms      Lumbar Exercises: Supine   Ab Set 10 reps;5 seconds    Clam 10 reps;3 seconds    Bridge 10 reps;5 seconds      Lumbar Exercises: Prone   Other Prone Lumbar Exercises prone plank 2x60 sec      Manual Therapy   Manual therapy comments skilled palpation and monitoring of soft tissue during DN; compression with STM to bil QL              Trigger Point Dry Needling - 10/14/20 0841     Consent Given? Yes    Education Handout Provided Yes    Muscles Treated Back/Hip Quadratus lumborum    Quadratus Lumborum Response Twitch response elicited  PT Long Term Goals - 10/14/20 1020       PT LONG TERM GOAL #1   Title Ind with HEP to maintain strong core    Time 6    Period Weeks    Status On-going    Target Date 10/22/20      PT LONG TERM GOAL #2   Title Pt able to sleep without waking from back pain.    Time 6    Period Weeks    Status On-going      PT LONG TERM GOAL #3   Title Decreased pain with sitting, standing and walking by 75% or more.    Time 6    Period Weeks    Status On-going      PT LONG TERM GOAL #4   Title Pt able to demonstrate correct form when lifting, carrying and squatting >= 50# to protect back with work activities.    Time 6    Period Weeks    Status On-going      PT LONG TERM GOAL #5   Title Improved FOTO FS score to >=76    Baseline 69    Time 6    Period Weeks    Status On-going                   Plan - 10/14/20 1021     Clinical Impression Statement Pt returned to PT for first visit back following initial eval approx 4 weeks ago.  He reports compliance with HEP and about 20% improvement in  symptoms overall.  Did trial of DN and manual therapy today to assess response as he has some trigger points in his QL.  Will continue to benefit from PT to maximize function.    Personal Factors and Comorbidities Comorbidity 2    Comorbidities bil THA, possible spondylolysis    Examination-Activity Limitations Sleep    Stability/Clinical Decision Making Stable/Uncomplicated    Rehab Potential Excellent    PT Frequency 1x / week    PT Duration 6 weeks    PT Treatment/Interventions ADLs/Self Care Home Management;Cryotherapy;Electrical Stimulation;Moist Heat;Therapeutic exercise;Therapeutic activities;Patient/family education;Manual techniques;Dry needling    PT Next Visit Plan progress TA and core strength including planks/side planks; assess squats and lifting techniques (see goal); will need recert next visit    PT [place] and Agree with Plan of Care Patient             Patient will benefit from skilled therapeutic intervention in order to improve the following deficits and impairments:  Increased muscle spasms, Pain, Postural dysfunction, Decreased strength, Impaired flexibility  Visit Diagnosis: Chronic bilateral low back pain without sciatica  Muscle weakness (generalized)     Problem List Patient Active Problem List   Diagnosis Date Noted   Prolapsed internal hemorrhoids, grade 2, bleeding 08/23/2017   OA (osteoarthritis) of hip 07/08/2015   Osteoarthritis of left hip 05/20/2015   Obesity (BMI 30-39.9) 06/13/2013   HIP PAIN, RIGHT 03/20/2009   SHOULDER PAIN, RIGHT 12/05/2008   ERECTILE DYSFUNCTION, MILD 12/18/2007   SORE THROAT 12/18/2007   ADJUSTMENT DISORDER WITH ANXIETY 03/08/2007   Essential hypertension 09/28/2006   ALLERGIC RHINITIS 09/28/2006   INGUINAL HERNIA, RIGHT 09/28/2006      Laureen Abrahams, PT, DPT 10/14/20 10:23 AM     [facility] [address] [place], [place],  [address] Phone: [phone]   Fax:  [phone]  Name: Michael Bradford MRN: [id number] Date  of Birth: April 14, 1960      PHYSICAL THERAPY DISCHARGE SUMMARY  Visits from Start of Care: 2  Current functional level related to goals / functional outcomes: See above   Remaining deficits: unknown   Education / Equipment: HEP   Patient agrees to discharge. Patient goals were not met. Patient is being discharged due to not returning since the last visit.  Laureen Abrahams, PT, DPT 12/24/20 11:38 AM  [facility] Physical Therapy [address] [place], [place], [address] Phone: [phone]   Fax:  [phone]

## 2020-10-21 ENCOUNTER — Encounter: Payer: No Typology Code available for payment source | Admitting: Physical Therapy

## 2020-10-26 NOTE — Progress Notes (Deleted)
   I, [name], LAT, ATC, am serving as scribe for Dr. Clementeen Graham.  Michael Bradford is a 60 y.o. male who presents to [facility] at [facility] today for f/u of chronic LBP thought to be due to either spinal stenosis or spondylolisthesis.  He was last seen by Dr. Denyse Amass on 09/01/20 and was referred to PT of which he's completed 2 sessions.  Today, pt reports   Diagnostic imaging: L-spine XR- 09/01/20  Pertinent review of systems: ***  Relevant historical information: ***   Exam:  There were no vitals taken for this visit. General: Well Developed, well nourished, and in no acute distress.   MSK: ***    Lab and Radiology Results No results found for this or any previous visit (from the past 72 hour(s)). No results found.     Assessment and Plan: 60 y.o. male with ***   PDMP not reviewed this encounter. No orders of the defined types were placed in this encounter.  No orders of the defined types were placed in this encounter.    Discussed warning signs or symptoms. Please see discharge instructions. Patient expresses understanding.   ***

## 2020-10-27 ENCOUNTER — Ambulatory Visit: Payer: No Typology Code available for payment source | Admitting: Family Medicine

## 2020-10-29 ENCOUNTER — Encounter: Payer: No Typology Code available for payment source | Admitting: Physical Therapy

## 2020-11-18 ENCOUNTER — Other Ambulatory Visit: Payer: Self-pay | Admitting: Family Medicine

## 2020-11-20 ENCOUNTER — Other Ambulatory Visit: Payer: Self-pay | Admitting: Family Medicine

## 2020-11-25 ENCOUNTER — Other Ambulatory Visit: Payer: Self-pay | Admitting: Family Medicine

## 2021-01-17 HISTORY — PX: HIP ARTHROSCOPY: SUR88

## 2021-02-20 ENCOUNTER — Other Ambulatory Visit: Payer: Self-pay | Admitting: Family Medicine

## 2021-03-03 ENCOUNTER — Other Ambulatory Visit: Payer: Self-pay | Admitting: Family Medicine

## 2021-05-18 ENCOUNTER — Other Ambulatory Visit: Payer: Self-pay | Admitting: Family Medicine

## 2021-05-18 DIAGNOSIS — I10 Essential (primary) hypertension: Secondary | ICD-10-CM

## 2021-05-31 ENCOUNTER — Other Ambulatory Visit: Payer: Self-pay | Admitting: Family Medicine

## 2021-06-14 ENCOUNTER — Other Ambulatory Visit: Payer: Self-pay | Admitting: Family Medicine

## 2021-06-14 DIAGNOSIS — I10 Essential (primary) hypertension: Secondary | ICD-10-CM

## 2021-07-15 ENCOUNTER — Other Ambulatory Visit: Payer: Self-pay | Admitting: Family Medicine

## 2021-07-15 DIAGNOSIS — I10 Essential (primary) hypertension: Secondary | ICD-10-CM

## 2021-09-01 ENCOUNTER — Other Ambulatory Visit: Payer: Self-pay | Admitting: Family Medicine

## 2021-09-01 DIAGNOSIS — I10 Essential (primary) hypertension: Secondary | ICD-10-CM

## 2021-09-05 ENCOUNTER — Other Ambulatory Visit: Payer: Self-pay | Admitting: Family Medicine

## 2021-09-06 ENCOUNTER — Encounter: Payer: Self-pay | Admitting: Family Medicine

## 2021-09-06 ENCOUNTER — Ambulatory Visit (INDEPENDENT_AMBULATORY_CARE_PROVIDER_SITE_OTHER): Payer: No Typology Code available for payment source | Admitting: Family Medicine

## 2021-09-06 VITALS — BP 140/84 | HR 72 | Temp 98.0°F | Ht 72.05 in | Wt 237.7 lb

## 2021-09-06 DIAGNOSIS — Z0000 Encounter for general adult medical examination without abnormal findings: Secondary | ICD-10-CM

## 2021-09-06 LAB — BASIC METABOLIC PANEL
BUN: 16 mg/dL (ref 6–23)
CO2: 29 mEq/L (ref 19–32)
Calcium: 9.9 mg/dL (ref 8.4–10.5)
Chloride: 101 mEq/L (ref 96–112)
Creatinine, Ser: 0.96 mg/dL (ref 0.40–1.50)
GFR: 85.25 mL/min (ref >60.00)
Glucose, Bld: 106 mg/dL — ABNORMAL HIGH (ref 70–99)
Potassium: 4.3 mEq/L (ref 3.5–5.1)
Sodium: 138 mEq/L (ref 135–145)

## 2021-09-06 LAB — CBC WITH DIFFERENTIAL/PLATELET
Basophils Absolute: 0 10*3/uL (ref 0.0–0.1)
Basophils Relative: 0.4 % (ref 0.0–3.0)
Eosinophils Absolute: 0.1 10*3/uL (ref 0.0–0.7)
Eosinophils Relative: 2.8 % (ref 0.0–5.0)
HCT: 45.2 % (ref 39.0–52.0)
Hemoglobin: 15.6 g/dL (ref 13.0–17.0)
Lymphocytes Relative: 29.3 % (ref 12.0–46.0)
Lymphs Abs: 1.4 10*3/uL (ref 0.7–4.0)
MCHC: 34.4 g/dL (ref 30.0–36.0)
MCV: 97.3 fl (ref 78.0–100.0)
Monocytes Absolute: 0.5 10*3/uL (ref 0.1–1.0)
Monocytes Relative: 10.4 % (ref 3.0–12.0)
Neutro Abs: 2.7 10*3/uL (ref 1.4–7.7)
Neutrophils Relative %: 57.1 % (ref 43.0–77.0)
Platelets: 148 10*3/uL — ABNORMAL LOW (ref 150.0–400.0)
RBC: 4.64 Mil/uL (ref 4.22–5.81)
RDW: 13.1 % (ref 11.5–15.5)
WBC: 4.7 10*3/uL (ref 4.0–10.5)

## 2021-09-06 LAB — TSH: TSH: 1.98 u[IU]/mL (ref 0.35–5.50)

## 2021-09-06 LAB — LIPID PANEL
Cholesterol: 221 mg/dL — ABNORMAL HIGH (ref 0–200)
HDL: 48.6 mg/dL (ref >39.00)
LDL Cholesterol: 147 mg/dL — ABNORMAL HIGH (ref 0–99)
NonHDL: 172.08
Total CHOL/HDL Ratio: 5
Triglycerides: 125 mg/dL (ref 0.0–149.0)
VLDL: 25 mg/dL (ref 0.0–40.0)

## 2021-09-06 LAB — HEPATIC FUNCTION PANEL
ALT: 22 U/L (ref 0–53)
AST: 30 U/L (ref 0–37)
Albumin: 4.5 g/dL (ref 3.5–5.2)
Alkaline Phosphatase: 63 U/L (ref 39–117)
Bilirubin, Direct: 0.2 mg/dL (ref 0.0–0.3)
Total Bilirubin: 1.1 mg/dL (ref 0.2–1.2)
Total Protein: 7.1 g/dL (ref 6.0–8.3)

## 2021-09-06 LAB — HEMOGLOBIN A1C: Hgb A1c MFr Bld: 5.6 % (ref 4.6–6.5)

## 2021-09-06 LAB — PSA: PSA: 1.61 ng/mL (ref 0.10–4.00)

## 2021-09-06 MED ORDER — LOSARTAN POTASSIUM-HCTZ 100-25 MG PO TABS: 1.0000 | ORAL_TABLET | Freq: Every day | ORAL | 3 refills | Status: DC

## 2021-09-06 MED ORDER — AMLODIPINE BESYLATE 5 MG PO TABS: 5.0000 mg | ORAL_TABLET | Freq: Every day | ORAL | 3 refills | Status: DC

## 2021-09-06 NOTE — Progress Notes (Signed)
Established Patient Office Visit  Subjective   Patient ID: Michael Bradford, male    DOB: Feb 08, 1960  Age: 61 y.o. MRN: [id number]  Chief Complaint  Patient presents with   Annual Exam    HPI   Michael Bradford is seen for physical exam.  He has hypertension currently treated with losartan HCTZ and amlodipine 2.5 mg daily.  Compliant with therapy.  He has gained a little weight this year and he and his wife just last week start a weight loss program.  Generally doing well.  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  Health maintenance reviewed:  -Prior hepatitis C screen negative -Shingles vaccine already given -Colonoscopy due 2025 -Tetanus due 2027  Family history-aunts and uncles with type 2 diabetes.  Father had CAD age 24.  No family history of premature CAD.  Social history-he is married.  This is a second marriage.  He has a daughter and a son.  His daughter got married earlier this year.  Patient quit smoking 2015.  Past Medical History:  Diagnosis Date   ALLERGIC RHINITIS    Anxiety    situational   Herpes simplex    Hypertension    eval at age 60   Recurrent inguinal hernia    asymptomatic   Past Surgical History:  Procedure Laterality Date   COLONOSCOPY  02/10/2013   FINGER SURGERY  1970, 1971   times three   Right eye trauma  1975   SHOULDER SURGERY  12/21/10   right, ROTATOR CUFF REPAIR   TOTAL HIP ARTHROPLASTY Left 07/08/2015   Procedure: LEFT TOTAL HIP ARTHROPLASTY ANTERIOR APPROACH;  Surgeon: Ollen Gross, MD;  Location: [facility];  Service: Orthopedics;  Laterality: Left;   VASECTOMY  1998   vocal polyps  1992, 1996   times two   WISDOM TOOTH EXTRACTION  1976    reports that he quit smoking about 7 years ago. His smoking use included cigarettes. He has never used smokeless tobacco. He reports current alcohol use of about 10.0 standard drinks of alcohol per week. He reports that he does not use drugs. family history  includes Arthritis in his mother; Colon polyps (age of onset: 35) in his father; Diabetes in his mother; Heart disease (age of onset: 43) in his father; Hyperlipidemia in his brother; Hypertension in his brother and father; Obesity in his father; Thyroid disease in his sister. No Known Allergies   Review of Systems  Constitutional:  Negative for chills, fever, malaise/fatigue and weight loss.  HENT:  Negative for hearing loss.   Eyes:  Negative for blurred vision and double vision.  Respiratory:  Negative for cough and shortness of breath.   Cardiovascular:  Negative for chest pain, palpitations and leg swelling.  Gastrointestinal:  Negative for abdominal pain, blood in stool, constipation and diarrhea.  Genitourinary:  Negative for dysuria.  Skin:  Negative for rash.  Neurological:  Negative for dizziness, speech change, seizures, loss of consciousness and headaches.  Psychiatric/Behavioral:  Negative for depression.       Objective:     BP (!) 140/84 (BP Location: Left Arm, Patient Position: Sitting, Cuff Size: Normal)   Pulse 72   Temp 98 F (36.7 C) (Oral)   Ht 6' 0.05" (1.83 m)   Wt 237 lb 11.2 oz (107.8 kg)   SpO2 98%   BMI 32.20 kg/m    Physical Exam Constitutional:      General: He is not in  acute distress.    Appearance: He is well-developed.  HENT:     Head: Normocephalic and atraumatic.     Right Ear: External ear normal.     Left Ear: External ear normal.  Eyes:     Conjunctiva/sclera: Conjunctivae normal.     Pupils: Pupils are equal, round, and reactive to light.  Neck:     Thyroid: No thyromegaly.  Cardiovascular:     Rate and Rhythm: Normal rate and regular rhythm.     Heart sounds: Normal heart sounds. No murmur heard. Pulmonary:     Effort: No respiratory distress.     Breath sounds: No wheezing or rales.  Abdominal:     General: Bowel sounds are normal. There is no distension.     Palpations: Abdomen is soft. There is no mass.     Tenderness:  There is no abdominal tenderness. There is no guarding or rebound.  Musculoskeletal:     Cervical back: Normal range of motion and neck supple.     Right lower leg: No edema.     Left lower leg: No edema.  Lymphadenopathy:     Cervical: No cervical adenopathy.  Skin:    Findings: No rash.  Neurological:     Mental Status: He is alert and oriented to person, place, and time.     Cranial Nerves: No cranial nerve deficit.     Deep Tendon Reflexes: Reflexes normal.      No results found for any visits on 09/06/21.    The 10-year ASCVD risk score (Arnett DK, et al., 2019) is: 12.7%    Assessment & Plan:   Problem List Items Addressed This Visit   None Visit Diagnoses     Physical exam    -  Primary   Relevant Orders   Lipid panel   Basic metabolic panel   CBC with Differential/Platelet   TSH   Hepatic function panel   PSA   Hemoglobin A1c     -Recommend annual flu vaccine.  He plans to get later -Other vaccines up-to-date -Increase amlodipine to 5 mg daily as he has had several home blood pressure readings up around 140 systolic -Try to keep daily sodium intake less than 2500 mg -Encouraged to lose some weight -Screening labs as above  No follow-ups on file.    Evelena Peat, MD

## 2021-09-15 ENCOUNTER — Telehealth: Payer: Self-pay | Admitting: Family Medicine

## 2021-09-15 NOTE — Telephone Encounter (Signed)
Pt called to say he had a change in insurance.   New insurance:  [employer] Rule ID # [id number] [phone]  Start date: 09/14/2021  Pt is asking that MD submit prior authorization for the following prescriptions: amLODipine (NORVASC) 5 MG tablet  sertraline (ZOLOFT) 50 MG tablet  LOV:  09/06/21 - CPE  [id number] - [place], [place] - [address] AT [facility] [address] Phone:  [phone]  Fax:  [phone]

## 2021-09-15 NOTE — Telephone Encounter (Signed)
I contacted the patient's pharmacy and they stated that no PA was needed for these medications and patient can pick up Amlodipine at discounted price. Sertraline 50 mg was picked up today

## 2021-11-08 ENCOUNTER — Other Ambulatory Visit: Payer: Self-pay | Admitting: Family Medicine

## 2022-02-01 ENCOUNTER — Telehealth: Payer: No Typology Code available for payment source | Admitting: Family Medicine

## 2022-02-01 ENCOUNTER — Encounter: Payer: Self-pay | Admitting: Family Medicine

## 2022-02-01 ENCOUNTER — Telehealth: Payer: Self-pay

## 2022-02-01 VITALS — BP 138/87 | Temp 97.0°F | Wt 238.0 lb

## 2022-02-01 DIAGNOSIS — R0600 Dyspnea, unspecified: Secondary | ICD-10-CM

## 2022-02-01 DIAGNOSIS — R059 Cough, unspecified: Secondary | ICD-10-CM

## 2022-02-01 DIAGNOSIS — U071 COVID-19: Secondary | ICD-10-CM | POA: Diagnosis not present

## 2022-02-01 LAB — POC COVID19 BINAXNOW: SARS Coronavirus 2 Ag: POSITIVE — AB

## 2022-02-01 MED ORDER — AZITHROMYCIN 250 MG PO TABS: ORAL_TABLET | ORAL | 0 refills | Status: DC

## 2022-02-01 MED ORDER — HYDROCODONE BIT-HOMATROP MBR 5-1.5 MG/5ML PO SOLN: 5.0000 mL | ORAL | 0 refills | Status: DC | PRN

## 2022-02-01 MED ORDER — ALBUTEROL SULFATE HFA 108 (90 BASE) MCG/ACT IN AERS: 1.0000 | INHALATION_SPRAY | RESPIRATORY_TRACT | 0 refills | Status: DC | PRN

## 2022-02-01 MED ORDER — METHYLPREDNISOLONE 4 MG PO TBPK: ORAL_TABLET | ORAL | 0 refills | Status: DC

## 2022-02-01 NOTE — Progress Notes (Signed)
   Subjective:    Patient ID: Michael Bradford, male    DOB: 06-28-1960, 62 y.o.   MRN: [id number]  HPI    Review of Systems     Objective:   Physical Exam        Assessment & Plan:

## 2022-02-01 NOTE — Progress Notes (Signed)
Subjective:    Patient ID: Michael Bradford, male    DOB: [birth year], 62 y.o.   MRN: [id number]  HPI Virtual Visit via Video Note  I connected with the patient on 02/01/22 at 10:15 AM EST by a video enabled telemedicine application and verified that I am speaking with the correct person using two identifiers.  Location patient: home Location provider:work or home office Persons participating in the virtual visit: patient, provider  I discussed the limitations of evaluation and management by telemedicine and the availability of in person appointments. The patient expressed understanding and agreed to proceed.   HPI: Here for 3 days of runny nose, ST, and coughing up green sputum. He gets SOB at times. No fever or chest pain. He tested positive for the Covid virus this morning.    ROS: See pertinent positives and negatives per HPI.  Past Medical History:  Diagnosis Date   ALLERGIC RHINITIS    Anxiety    situational   Herpes simplex    Hypertension    eval at age 60   Recurrent inguinal hernia    asymptomatic    Past Surgical History:  Procedure Laterality Date   COLONOSCOPY  02/10/2013   [place]   times three   Right eye trauma  1975   SHOULDER SURGERY  12/21/10   right, ROTATOR CUFF REPAIR   TOTAL HIP ARTHROPLASTY Left 07/08/2015   Procedure: LEFT TOTAL HIP ARTHROPLASTY ANTERIOR APPROACH;  Surgeon: Gaynelle Arabian, MD;  Location: [facility];  Service: Orthopedics;  Laterality: Left;   VASECTOMY  1998   vocal polyps  1992, 1996   times two   [place]    Family History  Problem Relation Age of Onset   Arthritis Mother    Diabetes Mother    Hypertension Father    Obesity Father    Heart disease Father 68       MI   Colon polyps Father 26   Thyroid disease Sister    Hyperlipidemia Brother    Hypertension Brother    Colon cancer Neg Hx      Current Outpatient Medications:    acyclovir (ZOVIRAX) 200 MG capsule, TAKE 1  CAPSULE (200 MG TOTAL) BY MOUTH DAILY AS NEEDED., Disp: 30 capsule, Rfl: 5   albuterol (PROVENTIL HFA;VENTOLIN HFA) 108 (90 Base) MCG/ACT inhaler, Inhale 1-2 puffs into the lungs as needed for wheezing., Disp: , Rfl: 0   amLODipine (NORVASC) 5 MG tablet, Take 1 tablet (5 mg total) by mouth daily., Disp: 90 tablet, Rfl: 3   losartan-hydrochlorothiazide (HYZAAR) 100-25 MG tablet, Take 1 tablet by mouth daily., Disp: 90 tablet, Rfl: 3   sertraline (ZOLOFT) 50 MG tablet, TAKE 1 TABLET BY MOUTH EVERY DAY, Disp: 90 tablet, Rfl: 1  EXAM:  VITALS per patient if applicable:  GENERAL: alert, oriented, appears well and in no acute distress  HEENT: atraumatic, conjunttiva clear, no obvious abnormalities on inspection of external nose and ears  NECK: normal movements of the head and neck  LUNGS: on inspection no signs of respiratory distress, breathing rate appears normal, no obvious gross SOB, gasping or wheezing  CV: no obvious cyanosis  MS: moves all visible extremities without noticeable abnormality  PSYCH/NEURO: pleasant and cooperative, no obvious depression or anxiety, speech and thought processing grossly intact  ASSESSMENT AND PLAN: Covid infection. Treat with a Zpack and a Medrol dose pack. Use albuterol inhaler as needed.  Alysia Penna, MD  Discussed the following  assessment and plan:  Cough, unspecified type - Plan: POC COVID-19  Dyspnea, unspecified type - Plan: POC COVID-19     I discussed the assessment and treatment plan with the patient. The patient was provided an opportunity to ask questions and all were answered. The patient agreed with the plan and demonstrated an understanding of the instructions.   The patient was advised to call back or seek an in-person evaluation if the symptoms worsen or if the condition fails to improve as anticipated.      Review of Systems     Objective:   Physical Exam        Assessment & Plan:

## 2022-02-01 NOTE — Telephone Encounter (Signed)
--  Caller states he is experiencing chest congestion, a cough, and chest tightness. He states that he has been dealing with the cough for 3 days and was up coughing all night. he has dark green phlegm. He has been taking delsym cough and sudefed  02/01/2022 8:23:39 AM See HCP within 4 Hours (or PCP triage) Quentin Cornwall, RN, [place]  Comments User: Morton Stall, RN Date/Time Eilene Ghazi Time): 02/01/2022 8:28:02 AM caller warm transferred to office for appt  Referrals REFERRED TO PCP OFFICE  Pt has appt with Dr Sarajane Jews today at [phone]

## 2022-02-01 NOTE — Progress Notes (Deleted)
u 

## 2022-02-28 ENCOUNTER — Other Ambulatory Visit: Payer: Self-pay | Admitting: Family Medicine

## 2022-03-30 ENCOUNTER — Other Ambulatory Visit: Payer: Self-pay | Admitting: Family Medicine

## 2022-04-07 IMAGING — DX DG LUMBAR SPINE 2-3V
3 series · 3 of 3 positions shown · non-contrast
Comparison: None.

CLINICAL DATA: Low back pain for 6 months

EXAM:
LUMBAR SPINE - 2-3 VIEW

[l-spine ap]
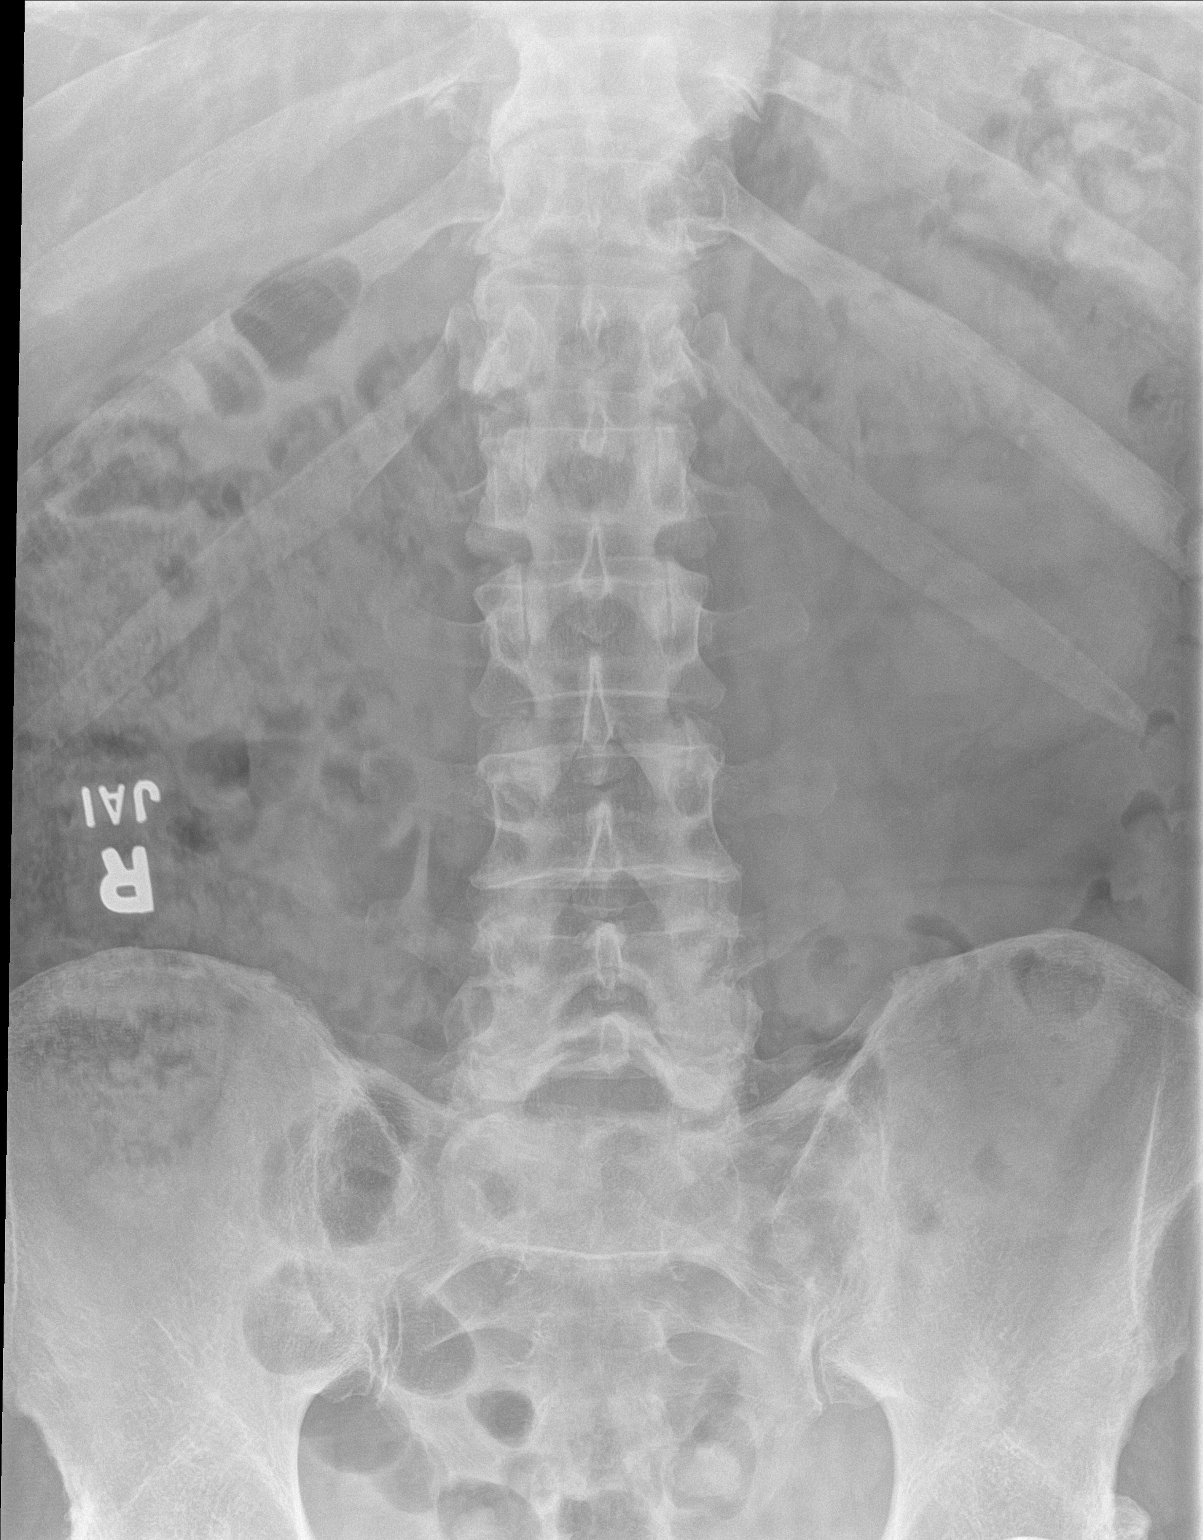

[l-spine lateral (1 of 2)]
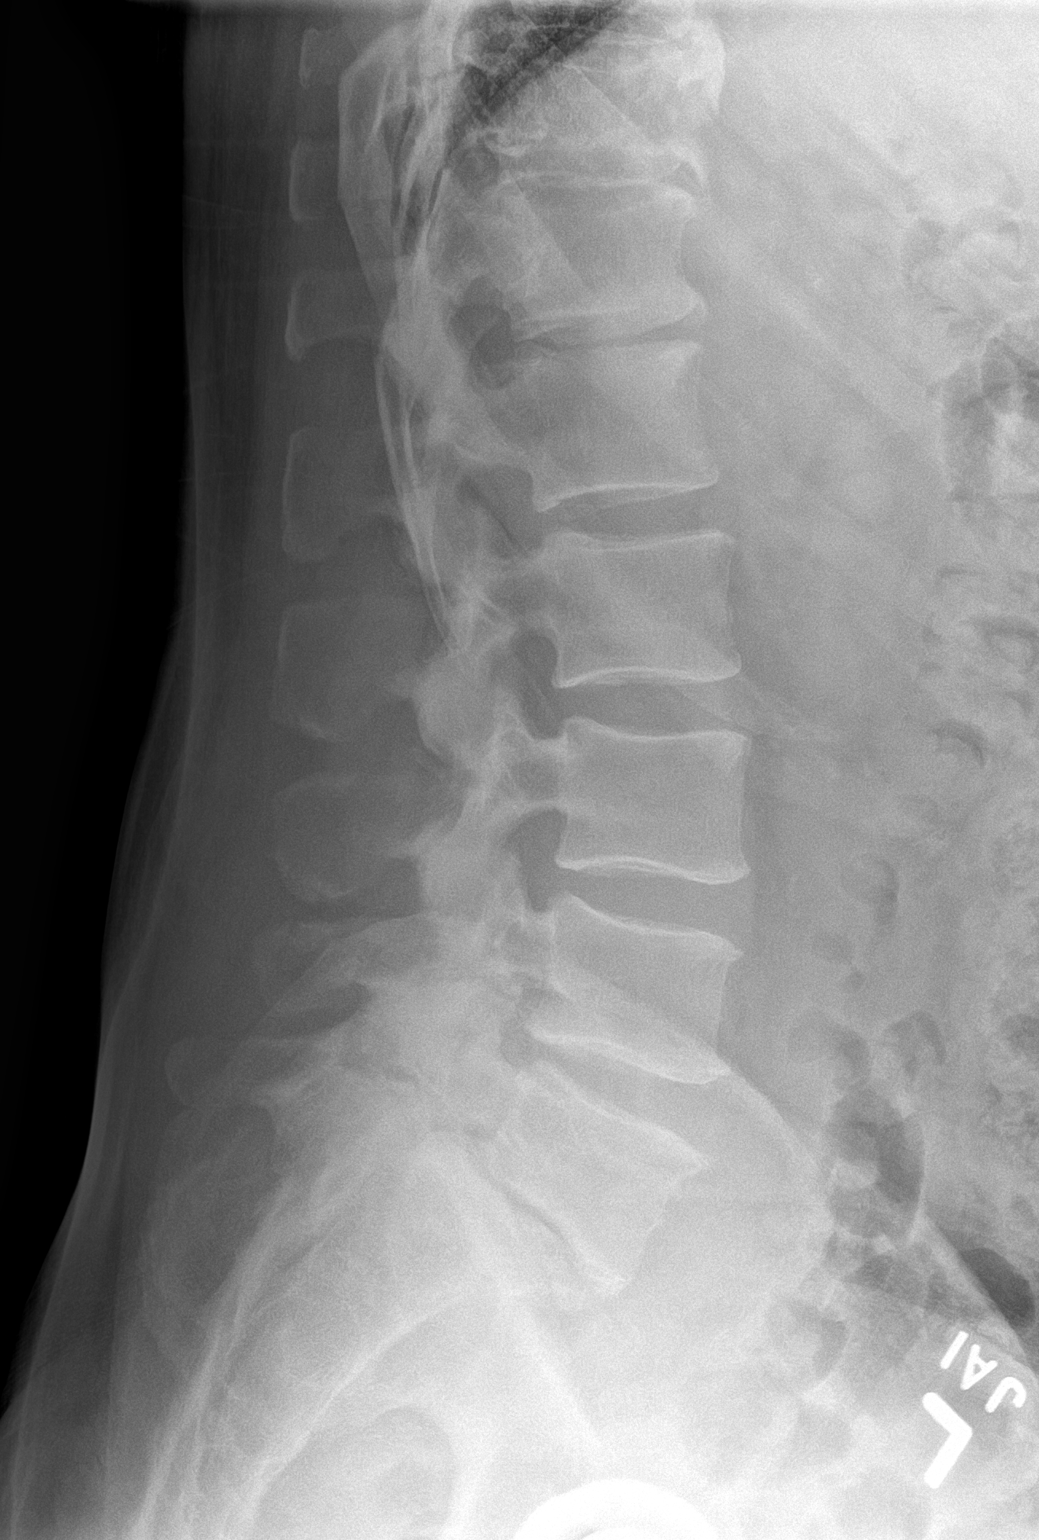

[l-spine lateral (2 of 2)]
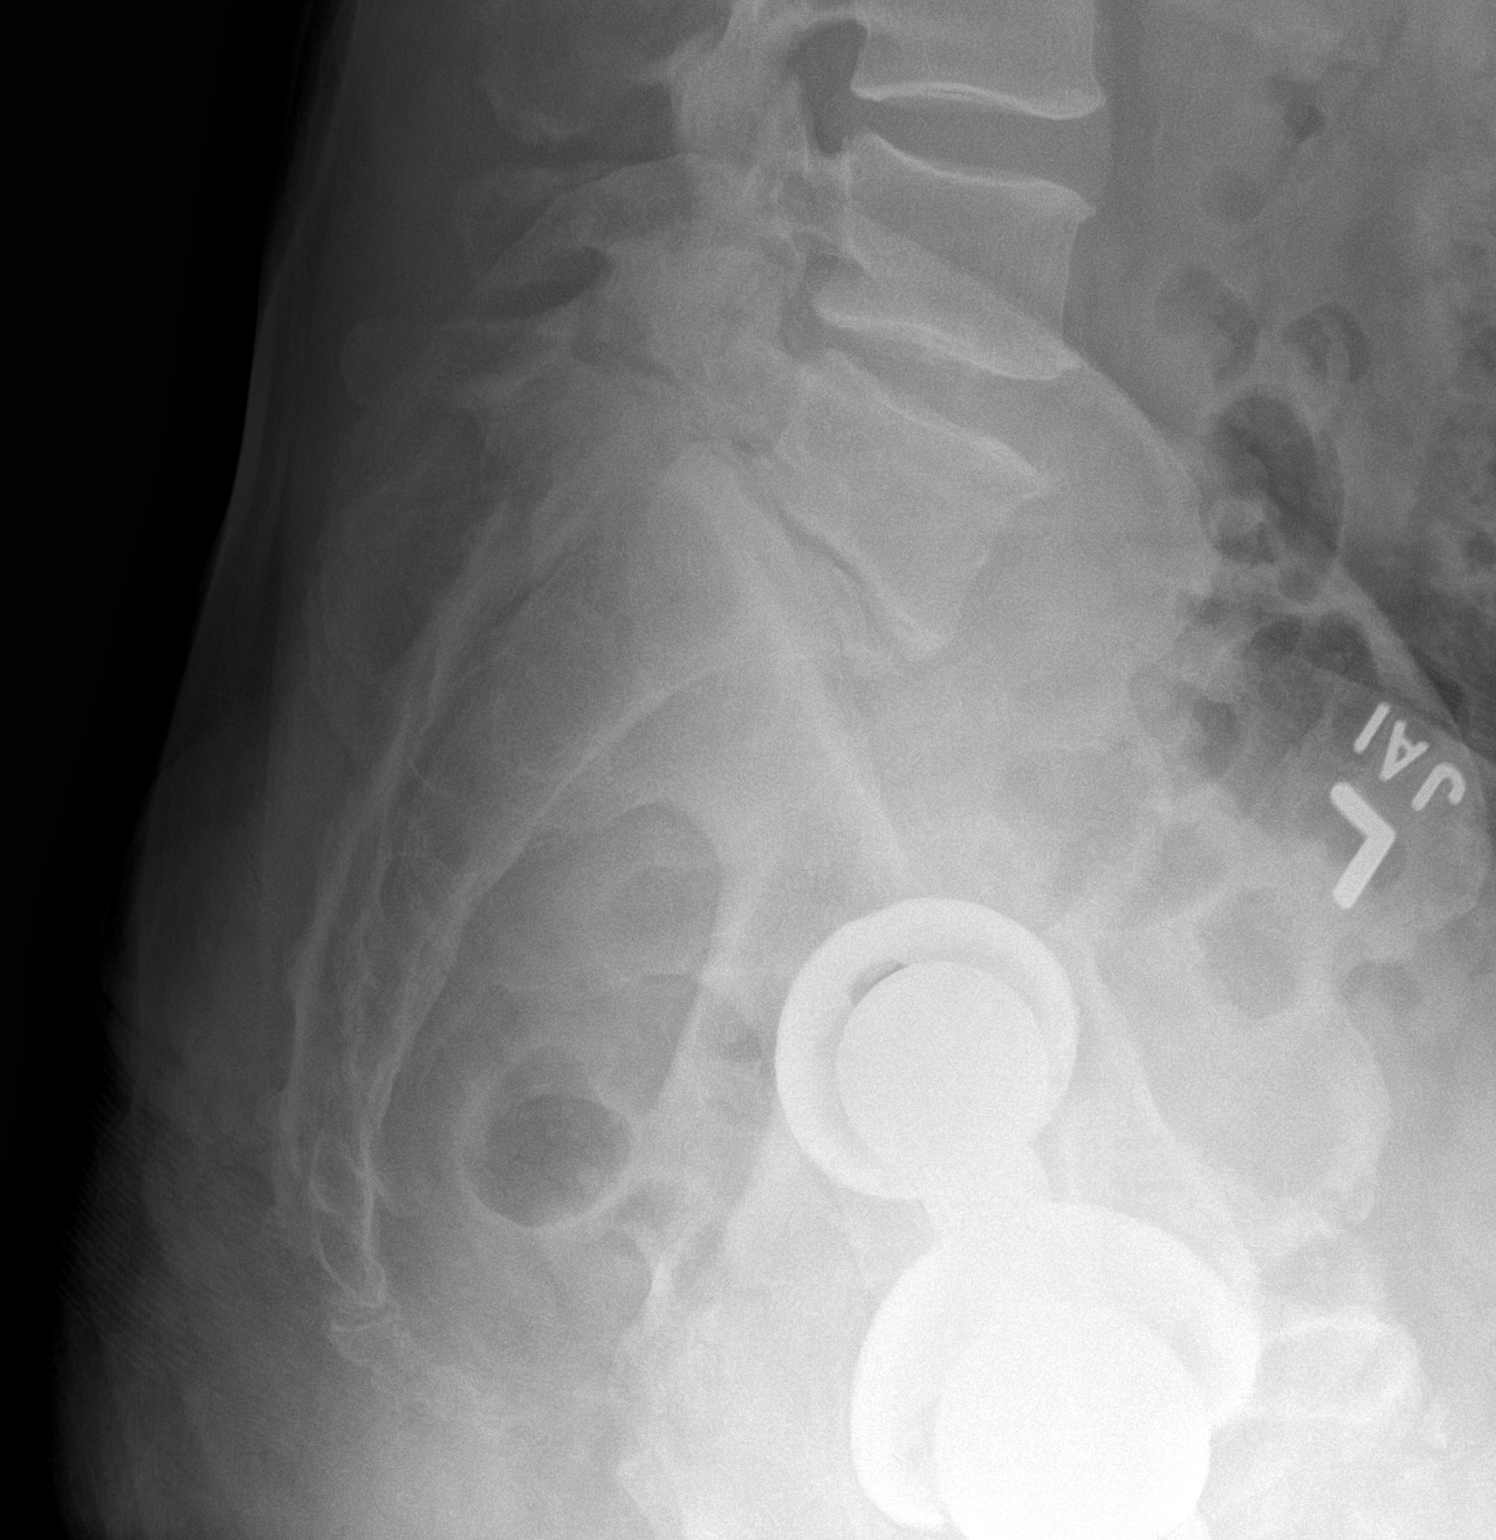

[3 of 3 positions shown; findings below may reference images not displayed]

FINDINGS: There is no evidence of lumbar spine fracture. Grade 2
anterolisthesis of L5 on S1 with suspected spondylolysis. Advanced
L5-S1 disc height loss. Trace retrolisthesis at L4-5 with minimal
disc height loss. The remaining intervertebral disc spaces are
maintained.
IMPRESSION: 1. Grade 2 anterolisthesis of L5 on S1 with suspected spondylolysis.
Advanced L5-S1 disc height loss.
2. Trace retrolisthesis at L4-5 with minimal disc height loss.

## 2022-05-09 DIAGNOSIS — M545 Low back pain, unspecified: Secondary | ICD-10-CM | POA: Diagnosis not present

## 2022-05-09 DIAGNOSIS — Z96642 Presence of left artificial hip joint: Secondary | ICD-10-CM | POA: Diagnosis not present

## 2022-05-09 DIAGNOSIS — M25552 Pain in left hip: Secondary | ICD-10-CM | POA: Diagnosis not present

## 2022-06-01 DIAGNOSIS — M25552 Pain in left hip: Secondary | ICD-10-CM | POA: Diagnosis not present

## 2022-06-04 ENCOUNTER — Other Ambulatory Visit: Payer: Self-pay | Admitting: Family Medicine

## 2022-06-09 DIAGNOSIS — M7062 Trochanteric bursitis, left hip: Secondary | ICD-10-CM | POA: Diagnosis not present

## 2022-09-03 ENCOUNTER — Other Ambulatory Visit: Payer: Self-pay | Admitting: Family Medicine

## 2022-09-05 ENCOUNTER — Other Ambulatory Visit: Payer: Self-pay | Admitting: Family Medicine

## 2022-10-02 ENCOUNTER — Other Ambulatory Visit: Payer: Self-pay | Admitting: Family Medicine

## 2022-11-03 ENCOUNTER — Other Ambulatory Visit: Payer: Self-pay | Admitting: Family Medicine

## 2022-12-05 ENCOUNTER — Other Ambulatory Visit: Payer: Self-pay | Admitting: Family Medicine

## 2023-01-03 ENCOUNTER — Other Ambulatory Visit: Payer: Self-pay | Admitting: Family Medicine

## 2023-01-26 ENCOUNTER — Encounter: Payer: Self-pay | Admitting: Internal Medicine

## 2023-01-30 ENCOUNTER — Other Ambulatory Visit: Payer: Self-pay | Admitting: Family Medicine

## 2023-02-01 ENCOUNTER — Other Ambulatory Visit: Payer: Self-pay | Admitting: Family Medicine

## 2023-02-01 NOTE — Telephone Encounter (Signed)
 Copied from CRM [id number]. Topic: Clinical - Medication Refill >> Feb 01, 2023 12:07 PM Jennings Mohr D wrote: Most Recent Primary Care Visit:  Provider: Corita Diego A  Department: [facility]  Visit Type: MYCHART VIDEO VISIT  Date: 02/01/2022  Medication: losartan -hydrochlorothiazide (HYZAAR) 100-25 MG tablet &amLODipine  (NORVASC ) 5 MG tablet    Has the patient contacted their pharmacy? Yes (Agent: If no, request that the patient contact the pharmacy for the refill. If patient does not wish to contact the pharmacy document the reason why and proceed with request.) (Agent: If yes, when and what did the pharmacy advise?) provider needs to approve refill   Is this the correct pharmacy for this prescription? Yes If no, delete pharmacy and type the correct one.  This is the patient's preferred pharmacy:  [id number] - [place], [place] - [address] AT [address] [place] [address] Phone: [phone] Fax: [phone]     Has the prescription been filled recently? No  Is the patient out of the medication? No  Has the patient been seen for an appointment in the last year OR does the patient have an upcoming appointment? Yes  Can we respond through MyChart? Yes  Agent: Please be advised that Rx refills may take up to 3 business days. We ask that you follow-up with your pharmacy.

## 2023-02-03 ENCOUNTER — Other Ambulatory Visit: Payer: Self-pay | Admitting: Family Medicine

## 2023-02-06 ENCOUNTER — Ambulatory Visit (AMBULATORY_SURGERY_CENTER): Payer: No Typology Code available for payment source | Admitting: *Deleted

## 2023-02-06 VITALS — Ht 73.0 in | Wt 230.0 lb

## 2023-02-06 DIAGNOSIS — Z1211 Encounter for screening for malignant neoplasm of colon: Secondary | ICD-10-CM

## 2023-02-06 MED ORDER — SUFLAVE 178.7 G PO SOLR: 1.0000 | ORAL | 0 refills | Status: DC

## 2023-02-06 NOTE — Progress Notes (Signed)
Pt's name and DOB verified at the beginning of the pre-visit wit 2 identifiers  Pt denies any difficulty with ambulating,sitting, laying down or rolling side to side  Pt has no issues with ambulation   Pt has no issues moving head neck or swallowing  No egg or soy allergy known to patient   No issues known to pt with past sedation with any surgeries or procedures  Pt denies having issues being intubated  No FH of Malignant Hyperthermia  Pt is not on diet pills or shots  Pt is not on home 02   Pt is not on blood thinners   Pt denies issues with constipation   Pt is not on dialysis  Pt denise any abnormal heart rhythms   Pt denies any upcoming cardiac testing  Pt encouraged to use to use Singlecare or Goodrx to reduce cost   Patient's chart reviewed by Cathlyn Parsons CNRA prior to pre-visit and patient appropriate for the [facility].  Pre-visit completed and red dot placed by patient's name on their procedure day (on provider's schedule).  .  Visit by phone  Pt states weight is 230 lb  Instructed pt why it is important to and  to call if they have any changes in health or new medications. Directed them to the # given and on instructions.     Instructions reviewed. Pt given both [facility] main # and MD on call # prior to instructions.  Pt states understanding. Instructed to review again prior to procedure. Pt states they will.   Instructions sent by mail  and by My Chart

## 2023-02-13 ENCOUNTER — Other Ambulatory Visit: Payer: Self-pay | Admitting: Family Medicine

## 2023-02-15 ENCOUNTER — Encounter: Payer: No Typology Code available for payment source | Admitting: Family Medicine

## 2023-02-17 ENCOUNTER — Encounter: Payer: Self-pay | Admitting: Internal Medicine

## 2023-02-28 ENCOUNTER — Encounter: Payer: No Typology Code available for payment source | Admitting: Internal Medicine

## 2023-03-07 ENCOUNTER — Other Ambulatory Visit: Payer: Self-pay | Admitting: Family Medicine

## 2023-03-08 ENCOUNTER — Encounter: Payer: No Typology Code available for payment source | Admitting: Family Medicine

## 2023-03-10 ENCOUNTER — Ambulatory Visit (INDEPENDENT_AMBULATORY_CARE_PROVIDER_SITE_OTHER): Payer: No Typology Code available for payment source | Admitting: Family Medicine

## 2023-03-10 VITALS — BP 134/80 | HR 74 | Temp 97.4°F | Wt 239.5 lb

## 2023-03-10 DIAGNOSIS — Z125 Encounter for screening for malignant neoplasm of prostate: Secondary | ICD-10-CM

## 2023-03-10 DIAGNOSIS — D696 Thrombocytopenia, unspecified: Secondary | ICD-10-CM

## 2023-03-10 DIAGNOSIS — I10 Essential (primary) hypertension: Secondary | ICD-10-CM | POA: Diagnosis not present

## 2023-03-10 DIAGNOSIS — E785 Hyperlipidemia, unspecified: Secondary | ICD-10-CM | POA: Diagnosis not present

## 2023-03-10 MED ORDER — LOSARTAN POTASSIUM-HCTZ 100-25 MG PO TABS: 1.0000 | ORAL_TABLET | Freq: Every day | ORAL | 3 refills | Status: DC

## 2023-03-10 MED ORDER — SERTRALINE HCL 50 MG PO TABS: 50.0000 mg | ORAL_TABLET | Freq: Every day | ORAL | 3 refills | Status: DC

## 2023-03-10 MED ORDER — AMLODIPINE BESYLATE 5 MG PO TABS: 5.0000 mg | ORAL_TABLET | Freq: Every day | ORAL | 3 refills | Status: DC

## 2023-03-10 NOTE — Progress Notes (Signed)
Established Patient Office Visit  Subjective   Patient ID: Michael Bradford, male    DOB: 12-23-1960  Age: 63 y.o. MRN: [id number]  Chief Complaint  Patient presents with   Medical Management of Chronic Issues    HPI   Mic is seen for medical follow-up.  He has history of hypertension, osteoarthritis, hyperlipidemia.  Generally doing well.  Staying very busy with work and frequent travel.  Still exercising several times per week with pickleball and does some elliptical and resistance training as well.  No recent chest pains.  Hypertension treated with amlodipine and losartan HCTZ.  Due for follow-up labs.  He does have history of hyperlipidemia.  His father had MI age 5.  He has history of some chronic anxiety which is stable on sertraline 50 mg daily.  Requesting refill of that as well.  Past Medical History:  Diagnosis Date   ALLERGIC RHINITIS    Anxiety    situational   Herpes simplex    Hypertension    eval at age 26   Recurrent inguinal hernia    asymptomatic   Past Surgical History:  Procedure Laterality Date   COLONOSCOPY  02/10/2013   FINGER SURGERY  1970, 1971   times three   Right eye trauma  1975   SHOULDER SURGERY  12/21/10   right, ROTATOR CUFF REPAIR   TOTAL HIP ARTHROPLASTY Left 07/08/2015   Procedure: LEFT TOTAL HIP ARTHROPLASTY ANTERIOR APPROACH;  Surgeon: Ollen Gross, MD;  Location: [facility];  Service: Orthopedics;  Laterality: Left;   VASECTOMY  1998   vocal polyps  1992, 1996   times two   WISDOM TOOTH EXTRACTION  1976    reports that he quit smoking about 9 years ago. His smoking use included cigarettes. He started smoking about 19 years ago. He has never used smokeless tobacco. He reports current alcohol use of about 10.0 standard drinks of alcohol per week. He reports that he does not use drugs. family history includes Arthritis in his mother; Colon polyps (age of onset: 2) in his father; Diabetes in his mother; Heart disease (age of onset: 49)  in his father; Hyperlipidemia in his brother; Hypertension in his brother and father; Obesity in his father; Thyroid disease in his sister. No Known Allergies  Review of Systems  Constitutional:  Negative for malaise/fatigue.  Eyes:  Negative for blurred vision.  Respiratory:  Negative for shortness of breath.   Cardiovascular:  Negative for chest pain.  Neurological:  Negative for dizziness, weakness and headaches.      Objective:     BP 134/80 (BP Location: Left Arm, Patient Position: Sitting, Cuff Size: Large)   Pulse 74   Temp (!) 97.4 F (36.3 C) (Oral)   Wt 239 lb 8 oz (108.6 kg)   SpO2 96%   BMI 31.60 kg/m  BP Readings from Last 3 Encounters:  03/10/23 134/80  02/01/22 138/87  09/06/21 (!) 140/84   Wt Readings from Last 3 Encounters:  03/10/23 239 lb 8 oz (108.6 kg)  02/06/23 230 lb (104.3 kg)  02/01/22 238 lb (108 kg)      Physical Exam Vitals reviewed.  Constitutional:      General: He is not in acute distress.    Appearance: Normal appearance. He is well-developed.  HENT:     Head: Normocephalic and atraumatic.  Eyes:     Pupils: Pupils are equal, round, and reactive to light.  Neck:     Thyroid: No thyromegaly.  Cardiovascular:  Rate and Rhythm: Normal rate and regular rhythm.     Heart sounds: Normal heart sounds. No murmur heard. Pulmonary:     Effort: Pulmonary effort is normal. No respiratory distress.     Breath sounds: Normal breath sounds. No wheezing or rales.  Musculoskeletal:     Cervical back: Normal range of motion and neck supple.  Lymphadenopathy:     Cervical: No cervical adenopathy.  Neurological:     Mental Status: He is alert and oriented to person, place, and time.      No results found for any visits on 03/10/23.    The 10-year ASCVD risk score (Arnett DK, et al., 2019) is: 15.1%    Assessment & Plan:   Problem List Items Addressed This Visit       Unprioritized   Essential hypertension - Primary   Relevant  Medications   losartan-hydrochlorothiazide (HYZAAR) 100-25 MG tablet   amLODipine (NORVASC) 5 MG tablet   Other Visit Diagnoses       Hyperlipidemia, unspecified hyperlipidemia type       Relevant Medications   losartan-hydrochlorothiazide (HYZAAR) 100-25 MG tablet   amLODipine (NORVASC) 5 MG tablet   Other Relevant Orders   Lipid panel   CMP     Low platelet count (HCC)       Relevant Orders   CBC with Differential/Platelet     Prostate cancer screening       Relevant Orders   PSA     Patient has chronic medical problems as above.  Blood pressure stable on amlodipine and losartan HCTZ.  Refill both medications for 1 year.  Check CMP.  Continue low-sodium diet.  -History of mild thrombocytopenia.  Recheck CBC  -Family history of CAD in his father in his 13s.  Patient would like to further risk stratify especially in view of his history of hyperlipidemia.  Set up coronary calcium scan to further assess.  Handout given  -Continue regular exercise habits  -Patient requesting follow-up PSA for prostate cancer screening  No follow-ups on file.    Evelena Peat, MD

## 2023-03-20 ENCOUNTER — Ambulatory Visit: Payer: 59

## 2023-03-28 ENCOUNTER — Ambulatory Visit (AMBULATORY_SURGERY_CENTER)

## 2023-03-28 VITALS — Ht 73.0 in | Wt 232.0 lb

## 2023-03-28 DIAGNOSIS — Z1211 Encounter for screening for malignant neoplasm of colon: Secondary | ICD-10-CM

## 2023-03-28 NOTE — Progress Notes (Signed)
 No egg or soy allergy known to patient  No issues known to pt with past sedation with any surgeries or procedures Patient denies ever being told they had issues or difficulty with intubation  No FH of Malignant Hyperthermia Pt is not on diet pills or GLP medications Pt is not on home 02  Pt is not on blood thinners  Pt denies issues with constipation  No A fib or A flutter Have any cardiac testing pending--no Ambulates independently

## 2023-04-03 DIAGNOSIS — H35363 Drusen (degenerative) of macula, bilateral: Secondary | ICD-10-CM | POA: Diagnosis not present

## 2023-04-03 DIAGNOSIS — H353131 Nonexudative age-related macular degeneration, bilateral, early dry stage: Secondary | ICD-10-CM | POA: Diagnosis not present

## 2023-04-03 DIAGNOSIS — H1032 Unspecified acute conjunctivitis, left eye: Secondary | ICD-10-CM | POA: Diagnosis not present

## 2023-04-03 DIAGNOSIS — H59811 Chorioretinal scars after surgery for detachment, right eye: Secondary | ICD-10-CM | POA: Diagnosis not present

## 2023-04-05 ENCOUNTER — Encounter: Payer: No Typology Code available for payment source | Admitting: Internal Medicine

## 2023-04-13 ENCOUNTER — Encounter: Payer: Self-pay | Admitting: Internal Medicine

## 2023-04-18 ENCOUNTER — Encounter: Payer: Self-pay | Admitting: Internal Medicine

## 2023-04-18 ENCOUNTER — Ambulatory Visit (AMBULATORY_SURGERY_CENTER): Admitting: Internal Medicine

## 2023-04-18 VITALS — BP 122/82 | HR 61 | Temp 98.1°F | Resp 15 | Ht 73.0 in | Wt 232.0 lb

## 2023-04-18 DIAGNOSIS — Z1211 Encounter for screening for malignant neoplasm of colon: Secondary | ICD-10-CM

## 2023-04-18 DIAGNOSIS — K648 Other hemorrhoids: Secondary | ICD-10-CM

## 2023-04-18 DIAGNOSIS — K5730 Diverticulosis of large intestine without perforation or abscess without bleeding: Secondary | ICD-10-CM

## 2023-04-18 DIAGNOSIS — F430 Acute stress reaction: Secondary | ICD-10-CM | POA: Diagnosis not present

## 2023-04-18 MED ORDER — SODIUM CHLORIDE 0.9 % IV SOLN
500.0000 mL | Freq: Once | INTRAVENOUS | Status: DC
Start: 2023-04-18 — End: 2023-04-18

## 2023-04-18 NOTE — Patient Instructions (Addendum)
 No polyps or cancer seen.  You do have diverticulosis - thickened muscle rings and pouches in the colon wall. Please read the handout about this condition.  Still have some internal hemorrhoids.  Next routine colonoscopy or other screening test in 10 years - 2035.  I appreciate the opportunity to care for you. Michael Boop, MD, [facility]  There were no colon polyps seen today!  You will need another screening colonoscopy in 10 years, you will receive a letter at that time when you are due for the procedure.   Please call us at [phone] if you have a change in bowel habits, change in family history of colo-rectal cancer, rectal bleeding or other GI concern before that time.  Resume previous diet Continue present medications  Handouts/information given for hemorrhoids and diverticulosis  YOU HAD AN ENDOSCOPIC PROCEDURE TODAY AT THE [facility]:   Refer to the procedure report that was given to you for any specific questions about what was found during the examination.  If the procedure report does not answer your questions, please call your gastroenterologist to clarify.  If you requested that your care partner not be given the details of your procedure findings, then the procedure report has been included in a sealed envelope for you to review at your convenience later.  YOU SHOULD EXPECT: Some feelings of bloating in the abdomen. Passage of more gas than usual.  Walking can help get rid of the air that was put into your GI tract during the procedure and reduce the bloating. If you had a lower endoscopy (such as a colonoscopy or flexible sigmoidoscopy) you may notice spotting of blood in your stool or on the toilet paper. If you underwent a bowel prep for your procedure, you may not have a normal bowel movement for a few days.  Please Note:  You might notice some irritation and congestion in your nose or some drainage.  This is from the oxygen used during your procedure.  There  is no need for concern and it should clear up in a day or so.  SYMPTOMS TO REPORT IMMEDIATELY:  Following lower endoscopy (colonoscopy):  Excessive amounts of blood in the stool  Significant tenderness or worsening of abdominal pains  Swelling of the abdomen that is new, acute  Fever of 100F or higher For urgent or emergent issues, a gastroenterologist can be reached at any hour by calling [phone]. Do not use MyChart messaging for urgent concerns.   DIET:  We do recommend a small meal at first, but then you may proceed to your regular diet.  Drink plenty of fluids but you should avoid alcoholic beverages for 24 hours.  ACTIVITY:  You should plan to take it easy for the rest of today and you should NOT DRIVE or use heavy machinery until tomorrow (because of the sedation medicines used during the test).    FOLLOW UP: Our staff will call the number listed on your records the next business day following your procedure.  We will call around 7:15- 8:00 am to check on you and address any questions or concerns that you may have regarding the information given to you following your procedure. If we do not reach you, we will leave a message.     SIGNATURES/CONFIDENTIALITY: You and/or your care partner have signed paperwork which will be entered into your electronic medical record.  These signatures attest to the fact that that the information above on your After Visit Summary has been  reviewed and is understood.  Full responsibility of the confidentiality of this discharge information lies with you and/or your care-partner.

## 2023-04-18 NOTE — Progress Notes (Signed)
 [facility] History and Physical   Primary Care Physician:  Kristian Covey, MD   Reason for Procedure:    Encounter Diagnosis  Name Primary?   Special screening for malignant neoplasms, colon Yes     Plan:    colonoscopy     HPI: Michael Bradford is a 63 y.o. male presenting for screening exam. Last exam 2015 negative.   Past Medical History:  Diagnosis Date   ALLERGIC RHINITIS    Anxiety    situational   Herpes simplex    Hypertension    eval at age 75   Recurrent inguinal hernia    asymptomatic    Past Surgical History:  Procedure Laterality Date   COLONOSCOPY  02/10/2013   FINGER SURGERY  1970, 1971   times three   HEMORRHOID BANDING  2019   HIP ARTHROSCOPY Right 2023   Right eye trauma  1975   SHOULDER SURGERY  12/21/2010   right, ROTATOR CUFF REPAIR   TOTAL HIP ARTHROPLASTY Left 07/08/2015   Procedure: LEFT TOTAL HIP ARTHROPLASTY ANTERIOR APPROACH;  Surgeon: Ollen Gross, MD;  Location: [facility];  Service: Orthopedics;  Laterality: Left;   VASECTOMY  1998   vocal polyps  1992, 1996   times two   WISDOM TOOTH EXTRACTION  1976    Prior to Admission medications   Medication Sig Start Date End Date Taking? Authorizing Provider  amLODipine (NORVASC) 5 MG tablet Take 1 tablet (5 mg total) by mouth daily. 03/10/23  Yes Burchette, Elberta Fortis, MD  losartan-hydrochlorothiazide (HYZAAR) 100-25 MG tablet Take 1 tablet by mouth daily. 03/10/23  Yes Burchette, Elberta Fortis, MD  sertraline (ZOLOFT) 50 MG tablet Take 1 tablet (50 mg total) by mouth daily. 03/10/23  Yes Burchette, Elberta Fortis, MD  acyclovir (ZOVIRAX) 200 MG capsule TAKE 1 CAPSULE (200 MG TOTAL) BY MOUTH DAILY AS NEEDED. 12/09/19   Burchette, Elberta Fortis, MD  albuterol (VENTOLIN HFA) 108 (90 Base) MCG/ACT inhaler INHALE 1-2 PUFFS INTO THE LUNGS EVERY 4 HOURS AS NEEDED FOR WHEEZING OR SHORTNESS OF BREATH. 02/28/22   Burchette, Elberta Fortis, MD  Multiple Vitamin (MULTIVITAMIN) capsule Take 1 capsule by mouth daily.     [provider]    Current Outpatient Medications  Medication Sig Dispense Refill   amLODipine (NORVASC) 5 MG tablet Take 1 tablet (5 mg total) by mouth daily. 90 tablet 3   losartan-hydrochlorothiazide (HYZAAR) 100-25 MG tablet Take 1 tablet by mouth daily. 90 tablet 3   sertraline (ZOLOFT) 50 MG tablet Take 1 tablet (50 mg total) by mouth daily. 90 tablet 3   acyclovir (ZOVIRAX) 200 MG capsule TAKE 1 CAPSULE (200 MG TOTAL) BY MOUTH DAILY AS NEEDED. 30 capsule 5   albuterol (VENTOLIN HFA) 108 (90 Base) MCG/ACT inhaler INHALE 1-2 PUFFS INTO THE LUNGS EVERY 4 HOURS AS NEEDED FOR WHEEZING OR SHORTNESS OF BREATH. 18 each 0   Multiple Vitamin (MULTIVITAMIN) capsule Take 1 capsule by mouth daily.     Current Facility-Administered Medications  Medication Dose Route Frequency Provider Last Rate Last Admin   0.9 %  sodium chloride infusion  500 mL Intravenous Once Iva Boop, MD        Allergies as of 04/18/2023   (No Known Allergies)    Family History  Problem Relation Age of Onset   Arthritis Mother    Diabetes Mother    Hypertension Father    Obesity Father    Heart disease Father 10       MI  Colon polyps Father 52   Thyroid disease Sister    Hyperlipidemia Brother    Hypertension Brother    Colon cancer Neg Hx    Rectal cancer Neg Hx    Stomach cancer Neg Hx    Esophageal cancer Neg Hx     Social History   Socioeconomic History   Marital status: Married    Spouse name: Not on file   Number of children: Not on file   Years of education: Not on file   Highest education level: Not on file  Occupational History    Employer: [employer]  Tobacco Use   Smoking status: Former    Current packs/day: 0.00    Types: Cigarettes    Start date: 01/17/2004    Quit date: 01/16/2014    Years since quitting: 9.2   Smokeless tobacco: Never  Vaping Use   Vaping status: Never Used  Substance and Sexual Activity   Alcohol use: Yes    Alcohol/week: 10.0 standard  drinks of alcohol    Types: 10 Glasses of wine per week    Comment: 2 GLASSES OF WINE PER DAY   Drug use: No   Sexual activity: Not on file  Other Topics Concern   Not on file  Social History Narrative   The patient is divorced, 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      2 glasses of wine daily, former smoker, no substance abuse reported   Social Drivers of Corporate investment banker Strain: Not on file  Food Insecurity: Not on file  Transportation Needs: Not on file  Physical Activity: Not on file  Stress: Not on file  Social Connections: Not on file  Intimate Partner Violence: Not on file    Review of Systems:  All other review of systems negative except as mentioned in the HPI.  Physical Exam: Vital signs BP 120/79   Pulse 72   Temp 98.1 F (36.7 C) (Temporal)   Ht 6\' 1"  (1.854 m)   Wt 232 lb (105.2 kg)   SpO2 96%   BMI 30.61 kg/m   General:   Alert,  Well-developed, well-nourished, pleasant and cooperative in NAD Lungs:  Clear throughout to auscultation.   Heart:  Regular rate and rhythm; no murmurs, clicks, rubs,  or gallops. Abdomen:  Soft, nontender and nondistended. Normal bowel sounds.   Neuro/Psych:  Alert and cooperative. Normal mood and affect. A and O x 3   @Dema Timmons  Sena Slate, MD, [facility] [phone] (pager) 04/18/2023 9:00 AM@

## 2023-04-18 NOTE — Progress Notes (Signed)
 Pt's states no medical or surgical changes since previsit or office visit.

## 2023-04-18 NOTE — Progress Notes (Signed)
 Vss nad trans to pacu

## 2023-04-18 NOTE — Op Note (Signed)
 [facility] Patient Name: Michael Bradford Procedure Date: 04/18/2023 8:58 AM MRN: [id number] Endoscopist: Iva Boop , MD, [id number] Age: 63 Referring MD:  Date of Birth: July 11, 1960 Gender: Male Account #: [id number] Procedure:                Colonoscopy Indications:              Screening for colorectal malignant neoplasm, Last                            colonoscopy: 2015 Medicines:                Monitored Anesthesia Care Procedure:                Pre-Anesthesia Assessment:                           - Prior to the procedure, a History and Physical                            was performed, and patient medications and                            allergies were reviewed. The patient's tolerance of                            previous anesthesia was also reviewed. The risks                            and benefits of the procedure and the sedation                            options and risks were discussed with the patient.                            All questions were answered, and informed consent                            was obtained. Prior Anticoagulants: The patient has                            taken no anticoagulant or antiplatelet agents. ASA                            Grade Assessment: II - A patient with mild systemic                            disease. After reviewing the risks and benefits,                            the patient was deemed in satisfactory condition to                            undergo the procedure.  After obtaining informed consent, the colonoscope                            was passed under direct vision. Throughout the                            procedure, the patient's blood pressure, pulse, and                            oxygen saturations were monitored continuously. The                            CF [id number] #[id number] was introduced through the anus                            and advanced to the the cecum,  identified by                            appendiceal orifice and ileocecal valve. The                            colonoscopy was performed without difficulty. The                            patient tolerated the procedure well. The quality                            of the bowel preparation was good. The bowel                            preparation used was SUFLAVE via split dose                            instruction. The ileocecal valve, appendiceal                            orifice, and rectum were photographed. Scope In: 9:03:33 AM Scope Out: 9:21:54 AM Scope Withdrawal Time: 0 hours 13 minutes 24 seconds  Total Procedure Duration: 0 hours 18 minutes 21 seconds  Findings:                 The perianal and digital rectal examinations were                            normal. Pertinent negatives include normal prostate                            (size, shape, and consistency).                           A few small-mouthed diverticula were found in the                            sigmoid colon.  Internal hemorrhoids were found.                           The exam was otherwise without abnormality on                            direct and retroflexion views. Complications:            No immediate complications. Estimated Blood Loss:     Estimated blood loss: none. Impression:               - Diverticulosis in the sigmoid colon.                           - Internal hemorrhoids. + scars from prior banding                            (2019)                           - The examination was otherwise normal on direct                            and retroflexion views.                           - No specimens collected. Recommendation:           - Patient has a contact number available for                            emergencies. The signs and symptoms of potential                            delayed complications were discussed with the                            patient.  Return to normal activities tomorrow.                            Written discharge instructions were provided to the                            patient.                           - Resume previous diet.                           - Continue present medications.                           - Repeat colonoscopy in 10 years for screening                            purposes. Iva Boop, MD 04/18/2023 9:27:57 AM This report has been signed electronically.

## 2023-04-19 ENCOUNTER — Ambulatory Visit (INDEPENDENT_AMBULATORY_CARE_PROVIDER_SITE_OTHER): Admitting: Podiatry

## 2023-04-19 ENCOUNTER — Telehealth: Payer: Self-pay

## 2023-04-19 DIAGNOSIS — Z91199 Patient's noncompliance with other medical treatment and regimen due to unspecified reason: Secondary | ICD-10-CM

## 2023-04-19 NOTE — Progress Notes (Signed)
 No show

## 2023-04-19 NOTE — Telephone Encounter (Signed)
 No answer, left message to call if having any issues or concerns, B.Vale Mousseau RN

## 2023-04-20 ENCOUNTER — Ambulatory Visit (HOSPITAL_BASED_OUTPATIENT_CLINIC_OR_DEPARTMENT_OTHER)
Admission: RE | Admit: 2023-04-20 | Discharge: 2023-04-20 | Disposition: A | Discharge status: Home / Self Care | Payer: 59 | Source: Ambulatory Visit | Attending: Family Medicine | Admitting: Family Medicine

## 2023-04-20 ENCOUNTER — Ambulatory Visit: Admitting: Podiatry

## 2023-04-20 DIAGNOSIS — E785 Hyperlipidemia, unspecified: Secondary | ICD-10-CM | POA: Insufficient documentation

## 2023-04-20 DIAGNOSIS — M7751 Other enthesopathy of right foot: Secondary | ICD-10-CM | POA: Diagnosis not present

## 2023-04-20 DIAGNOSIS — I10 Essential (primary) hypertension: Secondary | ICD-10-CM | POA: Insufficient documentation

## 2023-04-20 NOTE — Progress Notes (Signed)
  Subjective:  Patient ID: Michael Bradford, male    DOB: 1960/02/11,  MRN: [id number]  Chief Complaint  Patient presents with   Foot Pain    Pain the ball of foot.     63 y.o. male presents with the above complaint.  Patient presents with right second metatarsophalangeal joint hurts with ambulation worse with pressure patient would like to discuss treatment options for this has not seen and was prior to seeing me pain scale 7 out of 10 dull aching nature he has not seen anyone else he plays a lot of pickleball.   Review of Systems: Negative except as noted in the HPI. Denies N/V/F/Ch.  Past Medical History:  Diagnosis Date   ALLERGIC RHINITIS    Anxiety    situational   Herpes simplex    Hypertension    eval at age 36   Recurrent inguinal hernia    asymptomatic    Current Outpatient Medications:    acyclovir (ZOVIRAX) 200 MG capsule, TAKE 1 CAPSULE (200 MG TOTAL) BY MOUTH DAILY AS NEEDED., Disp: 30 capsule, Rfl: 5   albuterol (VENTOLIN HFA) 108 (90 Base) MCG/ACT inhaler, INHALE 1-2 PUFFS INTO THE LUNGS EVERY 4 HOURS AS NEEDED FOR WHEEZING OR SHORTNESS OF BREATH., Disp: 18 each, Rfl: 0   amLODipine (NORVASC) 5 MG tablet, Take 1 tablet (5 mg total) by mouth daily., Disp: 90 tablet, Rfl: 3   losartan-hydrochlorothiazide (HYZAAR) 100-25 MG tablet, Take 1 tablet by mouth daily., Disp: 90 tablet, Rfl: 3   Multiple Vitamin (MULTIVITAMIN) capsule, Take 1 capsule by mouth daily., Disp: , Rfl:    sertraline (ZOLOFT) 50 MG tablet, Take 1 tablet (50 mg total) by mouth daily., Disp: 90 tablet, Rfl: 3  Social History   Tobacco Use  Smoking Status Former   Current packs/day: 0.00   Types: Cigarettes   Start date: 01/17/2004   Quit date: 01/16/2014   Years since quitting: 9.2  Smokeless Tobacco Never    No Known Allergies Objective:  There were no vitals filed for this visit. There is no height or weight on file to calculate BMI. Constitutional Well developed. Well nourished.   Vascular Dorsalis pedis pulses palpable bilaterally. Posterior tibial pulses palpable bilaterally. Capillary refill normal to all digits.  No cyanosis or clubbing noted. Pedal hair growth normal.  Neurologic Normal speech. Oriented to person, place, and time. Epicritic sensation to light touch grossly present bilaterally.  Dermatologic Nails well groomed and normal in appearance. No open wounds. No skin lesions.  Orthopedic: Pain on palpation of right second metatarsophalangeal joint pain with range of motion of the joint.  No pain at third fourth and fifth metatarsophalangeal joint.  Crepitus noted at the right first metatarsophalangeal joint no pain.   Radiographs: None Assessment:   1. Capsulitis of metatarsophalangeal (MTP) joint of right foot    Plan:  Patient was evaluated and treated and all questions answered.  Right second metatarsophalangeal joint capsulitis -All questions and concerns were discussed with the patient extensive due to given the amount of pain that is having he will benefit from steroid injection to help decrease inflammatory component associate with pain.  Patient agrees with plan like to proceed with steroid injection -A steroid injection was performed at right second MTP using 1% plain Lidocaine and 10 mg of Kenalog. This was well tolerated.   No follow-ups on file.

## 2023-04-27 ENCOUNTER — Other Ambulatory Visit (INDEPENDENT_AMBULATORY_CARE_PROVIDER_SITE_OTHER)

## 2023-04-27 DIAGNOSIS — E785 Hyperlipidemia, unspecified: Secondary | ICD-10-CM | POA: Diagnosis not present

## 2023-04-27 DIAGNOSIS — Z125 Encounter for screening for malignant neoplasm of prostate: Secondary | ICD-10-CM

## 2023-04-27 DIAGNOSIS — D696 Thrombocytopenia, unspecified: Secondary | ICD-10-CM | POA: Diagnosis not present

## 2023-04-27 LAB — COMPREHENSIVE METABOLIC PANEL WITH GFR
ALT: 20 U/L (ref 0–53)
AST: 20 U/L (ref 0–37)
Albumin: 4.5 g/dL (ref 3.5–5.2)
Alkaline Phosphatase: 67 U/L (ref 39–117)
BUN: 20 mg/dL (ref 6–23)
CO2: 26 meq/L (ref 19–32)
Calcium: 9.3 mg/dL (ref 8.4–10.5)
Chloride: 104 meq/L (ref 96–112)
Creatinine, Ser: 0.95 mg/dL (ref 0.40–1.50)
GFR: 85.35 mL/min (ref >60.00)
Glucose, Bld: 104 mg/dL — ABNORMAL HIGH (ref 70–99)
Potassium: 4.1 meq/L (ref 3.5–5.1)
Sodium: 139 meq/L (ref 135–145)
Total Bilirubin: 0.6 mg/dL (ref 0.2–1.2)
Total Protein: 6.9 g/dL (ref 6.0–8.3)

## 2023-04-27 LAB — LIPID PANEL
Cholesterol: 206 mg/dL — ABNORMAL HIGH (ref 0–200)
HDL: 59.6 mg/dL (ref >39.00)
LDL Cholesterol: 126 mg/dL — ABNORMAL HIGH (ref 0–99)
NonHDL: 146.59
Total CHOL/HDL Ratio: 3
Triglycerides: 104 mg/dL (ref 0.0–149.0)
VLDL: 20.8 mg/dL (ref 0.0–40.0)

## 2023-04-27 LAB — CBC WITH DIFFERENTIAL/PLATELET
Basophils Absolute: 0 10*3/uL (ref 0.0–0.1)
Basophils Relative: 0.3 % (ref 0.0–3.0)
Eosinophils Absolute: 0.1 10*3/uL (ref 0.0–0.7)
Eosinophils Relative: 2.3 % (ref 0.0–5.0)
HCT: 46.5 % (ref 39.0–52.0)
Hemoglobin: 15.8 g/dL (ref 13.0–17.0)
Lymphocytes Relative: 35.8 % (ref 12.0–46.0)
Lymphs Abs: 2.1 10*3/uL (ref 0.7–4.0)
MCHC: 34 g/dL (ref 30.0–36.0)
MCV: 98 fl (ref 78.0–100.0)
Monocytes Absolute: 0.5 10*3/uL (ref 0.1–1.0)
Monocytes Relative: 9 % (ref 3.0–12.0)
Neutro Abs: 3.1 10*3/uL (ref 1.4–7.7)
Neutrophils Relative %: 52.6 % (ref 43.0–77.0)
Platelets: 173 10*3/uL (ref 150.0–400.0)
RBC: 4.74 Mil/uL (ref 4.22–5.81)
RDW: 13.4 % (ref 11.5–15.5)
WBC: 5.8 10*3/uL (ref 4.0–10.5)

## 2023-04-27 LAB — PSA: PSA: 1.63 ng/mL (ref 0.10–4.00)

## 2023-04-28 ENCOUNTER — Encounter: Payer: Self-pay | Admitting: Family Medicine

## 2023-04-28 ENCOUNTER — Ambulatory Visit (INDEPENDENT_AMBULATORY_CARE_PROVIDER_SITE_OTHER): Admitting: Family Medicine

## 2023-04-28 VITALS — BP 120/78 | HR 69 | Temp 97.3°F | Ht 72.05 in | Wt 231.5 lb

## 2023-04-28 DIAGNOSIS — E785 Hyperlipidemia, unspecified: Secondary | ICD-10-CM

## 2023-04-28 DIAGNOSIS — Z0000 Encounter for general adult medical examination without abnormal findings: Secondary | ICD-10-CM | POA: Diagnosis not present

## 2023-04-28 DIAGNOSIS — R739 Hyperglycemia, unspecified: Secondary | ICD-10-CM

## 2023-04-28 LAB — POCT GLYCOSYLATED HEMOGLOBIN (HGB A1C): Hemoglobin A1C: 5.3 % (ref 4.0–5.6)

## 2023-04-28 MED ORDER — ROSUVASTATIN CALCIUM 10 MG PO TABS: 10.0000 mg | ORAL_TABLET | Freq: Every day | ORAL | 3 refills | Status: DC

## 2023-04-28 NOTE — Progress Notes (Signed)
 Established Patient Office Visit  Subjective   Patient ID: Michael Bradford, male    DOB: 02/19/1960  Age: 63 y.o. MRN: [id number]  Chief Complaint  Patient presents with   Annual Exam    HPI    Mic is seen for physical exam.  He has hypertension and osteoarthritis.  Hypertension treated with amlodipine and losartan/HCTZ.  Fairly health-conscious.  Plays pickle ball fairly regularly.  Has been trying to lose some weight.  Recent coronary calcium score 111.  Does have mild hyperlipidemia with total cholesterol 206 and LDL 126.  His father had history of CAD around age 37.  Also had glucose 104 fasting with no known history of prediabetes or diabetes.  He just had repeat colonoscopy last week  Health maintenance reviewed:  Health Maintenance  Topic Date Due   COVID-19 Vaccine (3 - Pfizer risk series) 11/14/2020   INFLUENZA VACCINE  08/18/2023   DTaP/Tdap/Td (3 - Td or Tdap) 05/19/2025   Colonoscopy  04/17/2033   Hepatitis C Screening  Completed   HIV Screening  Completed   Zoster Vaccines- Shingrix  Completed   HPV VACCINES  Aged Out   Meningococcal B Vaccine  Aged Out   - No history of pneumonia vaccination.  He will consider at pharmacy some point this year  Family history-father had CAD around age 59.  Mother 56 with diabetes and hypertension history.  He had 1 sister died of breast cancer complications.  2 brothers that died early in life 1 from pneumonia and 1 from "crib death ".  Social history-married.  Second marriage.  Has a daughter and son.  Quit smoking 2015.    Past Medical History:  Diagnosis Date   ALLERGIC RHINITIS    Anxiety    situational   Herpes simplex    Hypertension    eval at age 35   Recurrent inguinal hernia    asymptomatic   Past Surgical History:  Procedure Laterality Date   COLONOSCOPY  02/10/2013   FINGER SURGERY  1970, 1971   times three   HEMORRHOID BANDING  2019   HIP ARTHROSCOPY Right 2023   Right eye trauma  1975   SHOULDER  SURGERY  12/21/2010   right, ROTATOR CUFF REPAIR   TOTAL HIP ARTHROPLASTY Left 07/08/2015   Procedure: LEFT TOTAL HIP ARTHROPLASTY ANTERIOR APPROACH;  Surgeon: Ollen Gross, MD;  Location: [facility];  Service: Orthopedics;  Laterality: Left;   VASECTOMY  1998   vocal polyps  1992, 1996   times two   WISDOM TOOTH EXTRACTION  1976    reports that he quit smoking about 9 years ago. His smoking use included cigarettes. He started smoking about 19 years ago. He has never used smokeless tobacco. He reports current alcohol use of about 10.0 standard drinks of alcohol per week. He reports that he does not use drugs. family history includes Arthritis in his mother; Colon polyps (age of onset: 69) in his father; Diabetes in his mother; Heart disease (age of onset: 62) in his father; Hyperlipidemia in his brother; Hypertension in his brother and father; Obesity in his father; Thyroid disease in his sister. No Known Allergies  Review of Systems  Constitutional:  Negative for chills, fever and malaise/fatigue.  Eyes:  Negative for blurred vision.  Respiratory:  Negative for shortness of breath.   Cardiovascular:  Negative for chest pain.  Gastrointestinal:  Negative for abdominal pain.  Neurological:  Negative for dizziness, weakness and headaches.      Objective:  BP 120/78 (BP Location: Left Arm, Cuff Size: Normal)   Pulse 69   Temp (!) 97.3 F (36.3 C) (Oral)   Ht 6' 0.05" (1.83 m)   Wt 231 lb 8 oz (105 kg)   SpO2 96%   BMI 31.36 kg/m  BP Readings from Last 3 Encounters:  04/28/23 120/78  04/18/23 122/82  03/10/23 134/80   Wt Readings from Last 3 Encounters:  04/28/23 231 lb 8 oz (105 kg)  04/18/23 232 lb (105.2 kg)  03/28/23 232 lb (105.2 kg)   The 10-year ASCVD risk score (Arnett DK, et al., 2019) is: 10.5%   Values used to calculate the score:     Age: 52 years     Sex: Male     Is Non-Hispanic African American: No     Diabetic: No     Tobacco smoker: No     Systolic  Blood Pressure: 120 mmHg     Is BP treated: Yes     HDL Cholesterol: 59.6 mg/dL     Total Cholesterol: 206 mg/dL     Physical Exam Vitals reviewed.  Constitutional:      General: He is not in acute distress.    Appearance: He is well-developed. He is not ill-appearing.  HENT:     Right Ear: External ear normal.     Left Ear: External ear normal.  Eyes:     Pupils: Pupils are equal, round, and reactive to light.  Neck:     Thyroid: No thyromegaly.  Cardiovascular:     Rate and Rhythm: Normal rate and regular rhythm.  Pulmonary:     Effort: Pulmonary effort is normal. No respiratory distress.     Breath sounds: Normal breath sounds. No wheezing or rales.  Abdominal:     Palpations: Abdomen is soft.     Tenderness: There is no abdominal tenderness. There is no guarding or rebound.  Musculoskeletal:     Cervical back: Neck supple.     Right lower leg: No edema.     Left lower leg: No edema.  Neurological:     Mental Status: He is alert and oriented to person, place, and time.      Results for orders placed or performed in visit on 04/28/23  POC HgB A1c  Result Value Ref Range   Hemoglobin A1C 5.3 4.0 - 5.6 %   HbA1c POC (<> result, manual entry)     HbA1c, POC (prediabetic range)     HbA1c, POC (controlled diabetic range)      Last CBC Lab Results  Component Value Date   WBC 5.8 04/27/2023   HGB 15.8 04/27/2023   HCT 46.5 04/27/2023   MCV 98.0 04/27/2023   MCH 32.9 07/09/2015   RDW 13.4 04/27/2023   PLT 173.0 04/27/2023   Last metabolic panel Lab Results  Component Value Date   GLUCOSE 104 (H) 04/27/2023   NA 139 04/27/2023   K 4.1 04/27/2023   CL 104 04/27/2023   CO2 26 04/27/2023   BUN 20 04/27/2023   CREATININE 0.95 04/27/2023   GFR 85.35 04/27/2023   CALCIUM 9.3 04/27/2023   PROT 6.9 04/27/2023   ALBUMIN 4.5 04/27/2023   BILITOT 0.6 04/27/2023   ALKPHOS 67 04/27/2023   AST 20 04/27/2023   ALT 20 04/27/2023   ANIONGAP 6 07/09/2015   Last  lipids Lab Results  Component Value Date   CHOL 206 (H) 04/27/2023   HDL 59.60 04/27/2023   LDLCALC 126 (H) 04/27/2023  LDLDIRECT 162.9 03/12/2012   TRIG 104.0 04/27/2023   CHOLHDL 3 04/27/2023   Last hemoglobin A1c Lab Results  Component Value Date   HGBA1C 5.3 04/28/2023      The 10-year ASCVD risk score (Arnett DK, et al., 2019) is: 10.5%    Assessment & Plan:   Problem List Items Addressed This Visit   None Visit Diagnoses       Physical exam    -  Primary     Hyperglycemia       Relevant Orders   POC HgB A1c (Completed)     Hyperlipidemia, unspecified hyperlipidemia type       Relevant Medications   rosuvastatin (CRESTOR) 10 MG tablet   Other Relevant Orders   Lipid panel   Hepatic function panel     63 year old male with controlled hypertension on amlodipine and losartan/HCTZ.  Initial blood pressure up today but improved after rest.  Recent coronary calcium score of 111.  Total cholesterol 206 with LDL 126.  Calculated 10-year risk for CAD 10.5%.  Discussed pros and cons of statin use and he would like to be more proactive with starting statin given the above.  Initiate rosuvastatin 10 mg daily in future lab order for lipid and CMP in about 8 weeks.  Continue low saturated fat diet.  Also checked A1c today in view of recent fasting glucose 104 and this came back normal at 5.3%.  -Discussed newer guidelines regarding pneumonia vaccination after 50 and he will consider at some point this year  No follow-ups on file.    Evelena Peat, MD

## 2023-04-28 NOTE — Patient Instructions (Signed)
 Set up repeat labs in 2 months after starting the statin.

## 2023-05-18 ENCOUNTER — Ambulatory Visit: Admitting: Podiatry

## 2023-05-25 ENCOUNTER — Ambulatory Visit: Admitting: Podiatry

## 2023-05-25 DIAGNOSIS — M7751 Other enthesopathy of right foot: Secondary | ICD-10-CM

## 2023-05-25 DIAGNOSIS — M216X1 Other acquired deformities of right foot: Secondary | ICD-10-CM | POA: Diagnosis not present

## 2023-05-25 NOTE — Progress Notes (Signed)
 Subjective:  Patient ID: Michael Bradford, male    DOB: 02-14-1960,  MRN: [id number]  Chief Complaint  Patient presents with   Foot Pain    Pt stated that his foot is doing much better he stated that the injection helped a lot     63 y.o. male presents with the above complaint.  Patient presents with complaint of right second metatarsophalangeal joint pain he says doing a lot better.  He still has some residual pain denies any other acute complaints he is 80% better.  He would like to do another injection.   Review of Systems: Negative except as noted in the HPI. Denies N/V/F/Ch.  Past Medical History:  Diagnosis Date   ALLERGIC RHINITIS    Anxiety    situational   Herpes simplex    Hypertension    eval at age 70   Recurrent inguinal hernia    asymptomatic    Current Outpatient Medications:    acyclovir  (ZOVIRAX ) 200 MG capsule, TAKE 1 CAPSULE (200 MG TOTAL) BY MOUTH DAILY AS NEEDED., Disp: 30 capsule, Rfl: 5   albuterol  (VENTOLIN  HFA) 108 (90 Base) MCG/ACT inhaler, INHALE 1-2 PUFFS INTO THE LUNGS EVERY 4 HOURS AS NEEDED FOR WHEEZING OR SHORTNESS OF BREATH., Disp: 18 each, Rfl: 0   amLODipine  (NORVASC ) 5 MG tablet, Take 1 tablet (5 mg total) by mouth daily., Disp: 90 tablet, Rfl: 3   losartan -hydrochlorothiazide (HYZAAR) 100-25 MG tablet, Take 1 tablet by mouth daily., Disp: 90 tablet, Rfl: 3   Multiple Vitamin (MULTIVITAMIN) capsule, Take 1 capsule by mouth daily., Disp: , Rfl:    rosuvastatin  (CRESTOR ) 10 MG tablet, Take 1 tablet (10 mg total) by mouth daily., Disp: 90 tablet, Rfl: 3   sertraline  (ZOLOFT ) 50 MG tablet, Take 1 tablet (50 mg total) by mouth daily., Disp: 90 tablet, Rfl: 3  Social History   Tobacco Use  Smoking Status Former   Current packs/day: 0.00   Types: Cigarettes   Start date: 01/17/2004   Quit date: 01/16/2014   Years since quitting: 9.3  Smokeless Tobacco Never    No Known Allergies Objective:  There were no vitals filed for this  visit. There is no height or weight on file to calculate BMI. Constitutional Well developed. Well nourished.  Vascular Dorsalis pedis pulses palpable bilaterally. Posterior tibial pulses palpable bilaterally. Capillary refill normal to all digits.  No cyanosis or clubbing noted. Pedal hair growth normal.  Neurologic Normal speech. Oriented to person, place, and time. Epicritic sensation to light touch grossly present bilaterally.  Dermatologic Nails well groomed and normal in appearance. No open wounds. No skin lesions.  Orthopedic: Pain on palpation of right second metatarsophalangeal joint pain with range of motion of the joint.  No pain at third fourth and fifth metatarsophalangeal joint.  Crepitus noted at the right first metatarsophalangeal joint no pain.   Radiographs: None Assessment:   1. Capsulitis of metatarsophalangeal (MTP) joint of right foot   2. Plantar flexed metatarsal bone of right foot     Plan:  Patient was evaluated and treated and all questions answered.  Right second metatarsophalangeal joint capsulitis with underlying plantarflexed metatarsal -All questions and concerns were discussed with the patient extensive due to given the amount of pain that is having he will benefit from steroid injection to help decrease inflammatory component associate with pain.  Patient agrees with plan like to proceed with steroid injection -A second steroid injection was performed at right second MTP using 1% plain Lidocaine and  10 mg of Kenalog. This was well tolerated. - Shoe gear modification discussed - Power step insoles discussed   No follow-ups on file.

## 2023-06-27 DIAGNOSIS — S63287A Dislocation of proximal interphalangeal joint of left little finger, initial encounter: Secondary | ICD-10-CM | POA: Diagnosis not present

## 2023-06-27 DIAGNOSIS — S2241XA Multiple fractures of ribs, right side, initial encounter for closed fracture: Secondary | ICD-10-CM | POA: Diagnosis not present

## 2023-06-27 DIAGNOSIS — S63114A Dislocation of metacarpophalangeal joint of right thumb, initial encounter: Secondary | ICD-10-CM | POA: Diagnosis not present

## 2023-06-28 ENCOUNTER — Telehealth: Payer: Self-pay

## 2023-06-28 DIAGNOSIS — S63259S Unspecified dislocation of unspecified finger, sequela: Secondary | ICD-10-CM

## 2023-06-28 NOTE — Telephone Encounter (Signed)
 Messaged for ortho referral.

## 2023-06-28 NOTE — Transitions of Care (Post Inpatient/ED Visit) (Signed)
   06/28/2023  Name: Michael Bradford MRN: [id number] DOB: August 20, 1960  Today's TOC FU Call Status: Today's TOC FU Call Status:: Successful TOC FU Call Completed TOC FU Call Complete Date: 06/28/23 Patient's Name and Date of Birth confirmed.  Transition Care Management Follow-up Telephone Call Date of Discharge: 06/27/23 Discharge Facility: Other [facility]) Name of Other (Non[facility]) Discharge Facility: [facility] Emergency Department Type of Discharge: Emergency Department Reason for ED Visit: Orthopedic Conditions Orthopedic/Injury Diagnosis: Sprain or Strain How have you been since you were released from the hospital?: Same Any questions or concerns?: No  Items Reviewed: Did you receive and understand the discharge instructions provided?: Yes Medications obtained,verified, and reconciled?: Yes (Medications Reviewed) Any new allergies since your discharge?: No Dietary orders reviewed?: NA Do you have support at home?: Yes People in Home [RPT]: significant other  Medications Reviewed Today: Medications Reviewed Today     Reviewed by Liberty Seto B, CMA (Certified Medical Assistant) on 06/28/23 at 1308  Med List Status: <None>   Medication Order Taking? Sig Documenting Provider Last Dose Status Informant  acyclovir  (ZOVIRAX ) 200 MG capsule [id number] Yes TAKE 1 CAPSULE (200 MG TOTAL) BY MOUTH DAILY AS NEEDED. Marquetta Sit, MD Taking Active   albuterol  (VENTOLIN  HFA) 108 [phone] Base) MCG/ACT inhaler [id number] Yes INHALE 1-2 PUFFS INTO THE LUNGS EVERY 4 HOURS AS NEEDED FOR WHEEZING OR SHORTNESS OF BREATH. Marquetta Sit, MD Taking Active   amLODipine  (NORVASC ) 5 MG tablet [id number] Yes Take 1 tablet (5 mg total) by mouth daily. Marquetta Sit, MD Taking Active   losartan -hydrochlorothiazide (HYZAAR) 100-25 MG tablet [id number] Yes Take 1 tablet by mouth daily. Marquetta Sit, MD Taking Active   Multiple Vitamin (MULTIVITAMIN) capsule [id number] Yes Take  1 capsule by mouth daily. [provider] Taking Active   rosuvastatin  (CRESTOR ) 10 MG tablet [id number] Yes Take 1 tablet (10 mg total) by mouth daily. Marquetta Sit, MD Taking Active   sertraline  (ZOLOFT ) 50 MG tablet [id number] Yes Take 1 tablet (50 mg total) by mouth daily. Marquetta Sit, MD Taking Active             Home Care and Equipment/Supplies: Were Home Health Services Ordered?: No Any new equipment or medical supplies ordered?: No  Functional Questionnaire: Do you need assistance with bathing/showering or dressing?: No Do you need assistance with meal preparation?: No Do you need assistance with eating?: No Do you have difficulty maintaining continence: No Do you need assistance with getting out of bed/getting out of a chair/moving?: No Do you have difficulty managing or taking your medications?: No  Follow up appointments reviewed: PCP Follow-up appointment confirmed?: No MD Provider Line [phone] Given: No Specialist Hospital Follow-up appointment confirmed?: Yes Follow-Up Specialty Provider:: placed referral. Do you need transportation to your follow-up appointment?: No Do you understand care options if your condition(s) worsen?: Yes-patient verbalized understanding    SIGNATURE Clovia Reine, cma

## 2023-06-29 NOTE — Addendum Note (Signed)
 Addended by: Anselm Basset L on: 06/29/2023 08:00 AM   Modules accepted: Orders

## 2023-06-29 NOTE — Telephone Encounter (Signed)
 Referral placed.

## 2023-08-20 DIAGNOSIS — L03116 Cellulitis of left lower limb: Secondary | ICD-10-CM | POA: Diagnosis not present

## 2023-08-20 DIAGNOSIS — S90562A Insect bite (nonvenomous), left ankle, initial encounter: Secondary | ICD-10-CM | POA: Diagnosis not present

## 2023-08-20 DIAGNOSIS — S80862A Insect bite (nonvenomous), left lower leg, initial encounter: Secondary | ICD-10-CM | POA: Diagnosis not present

## 2023-08-21 DIAGNOSIS — S63253A Unspecified dislocation of left middle finger, initial encounter: Secondary | ICD-10-CM | POA: Diagnosis not present

## 2023-11-15 DIAGNOSIS — M2041 Other hammer toe(s) (acquired), right foot: Secondary | ICD-10-CM | POA: Diagnosis not present

## 2023-11-15 DIAGNOSIS — M79671 Pain in right foot: Secondary | ICD-10-CM | POA: Diagnosis not present

## 2023-11-15 DIAGNOSIS — M7741 Metatarsalgia, right foot: Secondary | ICD-10-CM | POA: Diagnosis not present

## 2023-12-18 DIAGNOSIS — M2041 Other hammer toe(s) (acquired), right foot: Secondary | ICD-10-CM | POA: Diagnosis not present

## 2023-12-18 DIAGNOSIS — M2021 Hallux rigidus, right foot: Secondary | ICD-10-CM | POA: Diagnosis not present

## 2023-12-18 DIAGNOSIS — M7741 Metatarsalgia, right foot: Secondary | ICD-10-CM | POA: Diagnosis not present

## 2024-02-02 ENCOUNTER — Other Ambulatory Visit: Payer: Self-pay | Admitting: Family Medicine

## 2024-02-02 MED ORDER — ROSUVASTATIN CALCIUM 10 MG PO TABS: 10.0000 mg | ORAL_TABLET | Freq: Every day | ORAL | 0 refills | Status: AC

## 2024-02-02 MED ORDER — SERTRALINE HCL 50 MG PO TABS: 50.0000 mg | ORAL_TABLET | Freq: Every day | ORAL | 0 refills | Status: AC

## 2024-02-02 MED ORDER — LOSARTAN POTASSIUM-HCTZ 100-25 MG PO TABS: 1.0000 | ORAL_TABLET | Freq: Every day | ORAL | 0 refills | Status: AC

## 2024-02-02 MED ORDER — AMLODIPINE BESYLATE 5 MG PO TABS: 5.0000 mg | ORAL_TABLET | Freq: Every day | ORAL | 0 refills | Status: AC

## 2024-02-02 NOTE — Telephone Encounter (Signed)
 Copied from CRM [id number]. Topic: Clinical - Medication Refill >> Feb 02, 2024 12:12 PM Shereese L wrote: Medication: rosuvastatin  (CRESTOR ) 10 MG tablet sertraline  (ZOLOFT ) 50 MG tablet amLODipine  (NORVASC ) 5 MG tablet losartan -hydrochlorothiazide (HYZAAR) 100-25 MG tablet    Has the patient contacted their pharmacy? Yes (Agent: If no, request that the patient contact the pharmacy for the refill. If patient does not wish to contact the pharmacy document the reason why and proceed with request.) (Agent: If yes, when and what did the pharmacy advise?)  This is the patient's preferred pharmacy:  [id number] [place], [place] - [address] AT [facility] OF [address] [place] [address] Phone: [phone] Fax: [phone]  [facility] - [place], [place] - [address] [place] [address] Phone: [phone] Fax: [phone]  Is this the correct pharmacy for this prescription? Yes If no, delete pharmacy and type the correct one.   Has the prescription been filled recently? Yes  Is the patient out of the medication? Yes  Has the patient been seen for an appointment in the last year OR does the patient have an upcoming appointment? Yes  Can we respond through MyChart? Yes  Agent: Please be advised that Rx refills may take up to 3 business days. We ask that you follow-up with your pharmacy.

## 2024-02-11 ENCOUNTER — Other Ambulatory Visit: Payer: Self-pay | Admitting: Family Medicine
